# Patient Record
Sex: Female | Born: 1950 | Race: White | Hispanic: No | Marital: Married | Smoking: Never smoker
Health system: Southern US, Community
[De-identification: ages and names within clinical notes are randomized; demographics above are authoritative.]

## PROBLEM LIST (undated history)

## (undated) DIAGNOSIS — M26609 Unspecified temporomandibular joint disorder, unspecified side: Secondary | ICD-10-CM

## (undated) DIAGNOSIS — Z87442 Personal history of urinary calculi: Secondary | ICD-10-CM

## (undated) DIAGNOSIS — Z8489 Family history of other specified conditions: Secondary | ICD-10-CM

## (undated) DIAGNOSIS — M1990 Unspecified osteoarthritis, unspecified site: Secondary | ICD-10-CM

## (undated) DIAGNOSIS — K219 Gastro-esophageal reflux disease without esophagitis: Secondary | ICD-10-CM

## (undated) HISTORY — DX: Unspecified osteoarthritis, unspecified site: M19.90

## (undated) HISTORY — DX: Personal history of urinary calculi: Z87.442

## (undated) HISTORY — DX: Unspecified temporomandibular joint disorder, unspecified side: M26.609

## (undated) HISTORY — PX: TUBAL LIGATION: SHX77

---

## 1999-12-22 ENCOUNTER — Other Ambulatory Visit: Admission: RE | Admit: 1999-12-22 | Discharge: 1999-12-22 | Payer: Self-pay | Admitting: Internal Medicine

## 2001-03-06 ENCOUNTER — Other Ambulatory Visit: Admission: RE | Admit: 2001-03-06 | Discharge: 2001-03-06 | Payer: Self-pay | Admitting: Internal Medicine

## 2003-03-05 ENCOUNTER — Other Ambulatory Visit: Admission: RE | Admit: 2003-03-05 | Discharge: 2003-03-05 | Payer: Self-pay | Admitting: Internal Medicine

## 2004-10-18 ENCOUNTER — Ambulatory Visit: Payer: Self-pay | Admitting: Internal Medicine

## 2004-10-25 ENCOUNTER — Ambulatory Visit: Payer: Self-pay | Admitting: Internal Medicine

## 2004-10-25 ENCOUNTER — Encounter: Payer: Self-pay | Admitting: Internal Medicine

## 2004-10-25 ENCOUNTER — Other Ambulatory Visit: Admission: RE | Admit: 2004-10-25 | Discharge: 2004-10-25 | Payer: Self-pay | Admitting: Internal Medicine

## 2006-04-13 ENCOUNTER — Ambulatory Visit: Payer: Self-pay | Admitting: Internal Medicine

## 2007-01-04 HISTORY — PX: COLONOSCOPY: SHX174

## 2007-02-06 ENCOUNTER — Ambulatory Visit: Payer: Self-pay | Admitting: Internal Medicine

## 2007-02-06 DIAGNOSIS — J069 Acute upper respiratory infection, unspecified: Secondary | ICD-10-CM | POA: Insufficient documentation

## 2007-02-06 DIAGNOSIS — M1990 Unspecified osteoarthritis, unspecified site: Secondary | ICD-10-CM

## 2007-02-06 DIAGNOSIS — Z87442 Personal history of urinary calculi: Secondary | ICD-10-CM

## 2007-02-06 HISTORY — DX: Personal history of urinary calculi: Z87.442

## 2007-02-06 HISTORY — DX: Unspecified osteoarthritis, unspecified site: M19.90

## 2007-02-20 ENCOUNTER — Encounter: Payer: Self-pay | Admitting: Internal Medicine

## 2007-03-07 ENCOUNTER — Ambulatory Visit: Payer: Self-pay | Admitting: Internal Medicine

## 2007-03-07 LAB — CONVERTED CEMR LAB
Ketones, urine, test strip: NEGATIVE
Nitrite: NEGATIVE
Urobilinogen, UA: NEGATIVE
pH: >=9

## 2007-03-08 LAB — CONVERTED CEMR LAB
ALT: 17 units/L (ref 0–35)
AST: 23 units/L (ref 0–37)
Alkaline Phosphatase: 60 units/L (ref 39–117)
BUN: 9 mg/dL (ref 6–23)
Basophils Relative: 0.7 % (ref 0.0–1.0)
Bilirubin, Direct: 0.1 mg/dL (ref 0.0–0.3)
CO2: 29 meq/L (ref 19–32)
Calcium: 9.8 mg/dL (ref 8.4–10.5)
Chloride: 102 meq/L (ref 96–112)
Cholesterol: 248 mg/dL (ref 0–200)
Eosinophils Relative: 4.1 % (ref 0.0–5.0)
GFR calc Af Amer: 83 mL/min
Glucose, Bld: 95 mg/dL (ref 70–99)
HDL: 61.2 mg/dL (ref >39.0)
Monocytes Relative: 10.4 % (ref 3.0–11.0)
Platelets: 239 10*3/uL (ref 150–400)
RBC: 4.47 M/uL (ref 3.87–5.11)
RDW: 12.3 % (ref 11.5–14.6)
Total CHOL/HDL Ratio: 4.1
Total Protein: 6.5 g/dL (ref 6.0–8.3)
Triglycerides: 69 mg/dL (ref 0–149)
VLDL: 14 mg/dL (ref 0–40)
WBC: 3.9 10*3/uL — ABNORMAL LOW (ref 4.5–10.5)

## 2007-03-13 ENCOUNTER — Ambulatory Visit: Payer: Self-pay | Admitting: Internal Medicine

## 2008-02-21 ENCOUNTER — Ambulatory Visit: Payer: Self-pay | Admitting: Internal Medicine

## 2008-02-21 DIAGNOSIS — M779 Enthesopathy, unspecified: Secondary | ICD-10-CM | POA: Insufficient documentation

## 2008-03-19 ENCOUNTER — Encounter: Payer: Self-pay | Admitting: Internal Medicine

## 2008-04-28 ENCOUNTER — Ambulatory Visit: Payer: Self-pay | Admitting: Internal Medicine

## 2008-04-28 LAB — CONVERTED CEMR LAB
ALT: 18 units/L (ref 0–35)
AST: 19 units/L (ref 0–37)
Albumin: 3.9 g/dL (ref 3.5–5.2)
Alkaline Phosphatase: 70 units/L (ref 39–117)
BUN: 9 mg/dL (ref 6–23)
Basophils Relative: 0.8 % (ref 0.0–3.0)
Bilirubin Urine: NEGATIVE
Bilirubin, Direct: 0.1 mg/dL (ref 0.0–0.3)
CO2: 32 meq/L (ref 19–32)
Direct LDL: 160.6 mg/dL
Eosinophils Relative: 4.6 % (ref 0.0–5.0)
GFR calc non Af Amer: 90.73 mL/min (ref >60)
Glucose, Bld: 90 mg/dL (ref 70–99)
HDL: 65.5 mg/dL (ref >39.00)
Hemoglobin, Urine: NEGATIVE
Hemoglobin: 13.3 g/dL (ref 12.0–15.0)
Ketones, ur: NEGATIVE mg/dL
Lymphocytes Relative: 44 % (ref 12.0–46.0)
Monocytes Relative: 9.2 % (ref 3.0–12.0)
Neutro Abs: 1.6 10*3/uL (ref 1.4–7.7)
Neutrophils Relative %: 41.4 % — ABNORMAL LOW (ref 43.0–77.0)
Nitrite: NEGATIVE
Potassium: 3.8 meq/L (ref 3.5–5.1)
RBC: 4.28 M/uL (ref 3.87–5.11)
Sodium: 143 meq/L (ref 135–145)
Total Protein, Urine: NEGATIVE mg/dL
Total Protein: 6.6 g/dL (ref 6.0–8.3)
Urine Glucose: NEGATIVE mg/dL
VLDL: 8.6 mg/dL (ref 0.0–40.0)
WBC: 3.8 10*3/uL — ABNORMAL LOW (ref 4.5–10.5)
pH: 7.5 (ref 5.0–8.0)

## 2008-05-05 ENCOUNTER — Ambulatory Visit: Payer: Self-pay | Admitting: Internal Medicine

## 2008-06-03 ENCOUNTER — Ambulatory Visit: Payer: Self-pay | Admitting: Gastroenterology

## 2008-07-14 ENCOUNTER — Ambulatory Visit: Payer: Self-pay | Admitting: Gastroenterology

## 2009-02-27 ENCOUNTER — Ambulatory Visit: Payer: Self-pay | Admitting: Family Medicine

## 2009-02-27 DIAGNOSIS — M26609 Unspecified temporomandibular joint disorder, unspecified side: Secondary | ICD-10-CM | POA: Insufficient documentation

## 2009-02-27 HISTORY — DX: Unspecified temporomandibular joint disorder, unspecified side: M26.609

## 2010-02-04 NOTE — Assessment & Plan Note (Signed)
Summary: Pain ear and jaw/cjr   Vital Signs:  Patient profile:   60 year old female Temp:     99.1 degrees F oral BP sitting:   140 / 70  (left arm) Cuff size:   regular  Vitals Entered By: Sid Falcon LPN (February 27, 2009 2:28 PM) CC: ear, jaw, headache pain   History of Present Illness: Several month history of progressive left jaw pain. No chest pain. Pain worse with eating. This started around last November. Symptoms have been somewhat progressive. Pain mostly around the TMJ joint region with radiation toward that ear. Possible bruxism. Patient saw her dentist and has been using Advil and warm compresses along with a mouthguard without much improvement. She has noticed a clicking sensation when she opens and closes her jaw.  Allergies (verified): No Known Drug Allergies  Past History:  Past Medical History: Last updated: 05/05/2008 Osteoarthritis Nephrolithiasis, hx of mild dyslipidemia with a high HDL  Past Surgical History: Last updated: 05/05/2008 Tubal ligation colonoscopy-none  Family History: Last updated: 03/13/2007 father history gout, renal stones, hypertension, mild cognitive impairment mother- history of DJD, status post left total knee replacement, history of hypertension; history of coronary artery disease two brothers in good health present history lymphoma uncle history pancreatic cancer  Social History: Last updated: 05/05/2008 no children Retired  from [employer] Married Never Smoked PMH reviewed for relevance, PSH reviewed for relevance  Review of Systems  The patient denies anorexia, fever, weight loss, chest pain, and headaches.    Physical Exam  General:  Well-developed,well-nourished,in no acute distress; alert,appropriate and cooperative throughout examination Head:  Normocephalic and atraumatic without obvious abnormalities. No apparent alopecia or balding. Ears:  External ear exam shows no significant lesions or deformities.   Otoscopic examination reveals clear canals, tympanic membranes are intact bilaterally without bulging, retraction, inflammation or discharge. Hearing is grossly normal bilaterally. Mouth:  patient has tenderness left TMJ joint and pain with opening and closing jaw. Oropharynx clear Neck:  No deformities, masses, or tenderness noted. Lungs:  Normal respiratory effort, chest expands symmetrically. Lungs are clear to auscultation, no crackles or wheezes. Heart:  normal rate and regular rhythm.   Skin:  no rashes Cervical Nodes:  No lymphadenopathy noted   Impression & Recommendations:  Problem # 1:  TMJ SYNDROME (ICD-524.60)  given failure of conservative treatment and several months of symptoms recommend referral to oral maxillofacial surgeon  Orders: Dental Referral (Dentist)  Patient Instructions: 1)  Avoid eating hard to chew foods. 2)  Continue Advil as needed.

## 2010-02-17 ENCOUNTER — Encounter (INDEPENDENT_AMBULATORY_CARE_PROVIDER_SITE_OTHER): Payer: Self-pay | Admitting: *Deleted

## 2010-02-17 ENCOUNTER — Other Ambulatory Visit: Payer: Self-pay | Admitting: Internal Medicine

## 2010-02-17 ENCOUNTER — Other Ambulatory Visit: Payer: 59

## 2010-02-17 DIAGNOSIS — E785 Hyperlipidemia, unspecified: Secondary | ICD-10-CM

## 2010-02-17 DIAGNOSIS — Z0000 Encounter for general adult medical examination without abnormal findings: Secondary | ICD-10-CM

## 2010-02-17 LAB — CBC WITH DIFFERENTIAL/PLATELET
Basophils Absolute: 0 10*3/uL (ref 0.0–0.1)
HCT: 39.2 % (ref 36.0–46.0)
Lymphs Abs: 1.6 10*3/uL (ref 0.7–4.0)
MCV: 93.5 fl (ref 78.0–100.0)
Monocytes Absolute: 0.4 10*3/uL (ref 0.1–1.0)
Monocytes Relative: 9.9 % (ref 3.0–12.0)
Neutrophils Relative %: 46.5 % (ref 43.0–77.0)
Platelets: 214 10*3/uL (ref 150.0–400.0)
RDW: 13.5 % (ref 11.5–14.6)

## 2010-02-17 LAB — BASIC METABOLIC PANEL
BUN: 15 mg/dL (ref 6–23)
Creatinine, Ser: 0.7 mg/dL (ref 0.4–1.2)
GFR: 90.18 mL/min (ref >60.00)
Glucose, Bld: 86 mg/dL (ref 70–99)
Potassium: 4.4 mEq/L (ref 3.5–5.1)

## 2010-02-17 LAB — LIPID PANEL
Cholesterol: 240 mg/dL — ABNORMAL HIGH (ref 0–200)
HDL: 65.7 mg/dL (ref >39.00)

## 2010-02-17 LAB — URINALYSIS
Bilirubin Urine: NEGATIVE
Hgb urine dipstick: NEGATIVE
Ketones, ur: NEGATIVE
Total Protein, Urine: NEGATIVE
pH: 5.5 (ref 5.0–8.0)

## 2010-02-17 LAB — HEPATIC FUNCTION PANEL: Total Bilirubin: 0.7 mg/dL (ref 0.3–1.2)

## 2010-02-19 ENCOUNTER — Encounter: Payer: Self-pay | Admitting: Internal Medicine

## 2010-02-22 ENCOUNTER — Encounter: Payer: Self-pay | Admitting: Internal Medicine

## 2010-02-22 ENCOUNTER — Other Ambulatory Visit (HOSPITAL_COMMUNITY)
Admission: RE | Admit: 2010-02-22 | Discharge: 2010-02-22 | Disposition: A | Discharge status: Home / Self Care | Payer: 59 | Source: Ambulatory Visit | Attending: Internal Medicine | Admitting: Internal Medicine

## 2010-02-22 ENCOUNTER — Ambulatory Visit (INDEPENDENT_AMBULATORY_CARE_PROVIDER_SITE_OTHER): Payer: 59 | Admitting: Internal Medicine

## 2010-02-22 VITALS — BP 110/70 | HR 77 | Temp 98.2°F | Resp 14 | Ht 69.0 in | Wt 166.0 lb

## 2010-02-22 DIAGNOSIS — M1990 Unspecified osteoarthritis, unspecified site: Secondary | ICD-10-CM

## 2010-02-22 DIAGNOSIS — M26609 Unspecified temporomandibular joint disorder, unspecified side: Secondary | ICD-10-CM

## 2010-02-22 DIAGNOSIS — Z01419 Encounter for gynecological examination (general) (routine) without abnormal findings: Secondary | ICD-10-CM | POA: Insufficient documentation

## 2010-02-22 DIAGNOSIS — Z87442 Personal history of urinary calculi: Secondary | ICD-10-CM

## 2010-02-22 DIAGNOSIS — Z0000 Encounter for general adult medical examination without abnormal findings: Secondary | ICD-10-CM

## 2010-02-22 NOTE — Progress Notes (Signed)
  Subjective:    Patient ID: Tara Acosta, female    DOB: [birth year], 60 y.o.   MRN: [id number]  HPI  A 60 year old patient who is seen today for a health maintenance examination.  She does remarkably well.  This past fall.  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  She has a history of nephrolithiasis, which has been asymptomatic.  No concerns or complaints.  She did have a mammogram performed last month.  A colonoscopy done in 2009    Review of Systems  Constitutional: Negative for fever, appetite change, fatigue and unexpected weight change.  HENT: Negative for hearing loss, ear pain, nosebleeds, congestion, sore throat, mouth sores, trouble swallowing, neck stiffness, dental problem, voice change, sinus pressure and tinnitus.   Eyes: Negative for photophobia, pain, redness and visual disturbance.  Respiratory: Negative for cough, chest tightness and shortness of breath.   Cardiovascular: Negative for chest pain, palpitations and leg swelling.  Gastrointestinal: Negative for nausea, vomiting, abdominal pain, diarrhea, constipation, blood in stool, abdominal distention and rectal pain.  Genitourinary: Negative for dysuria, urgency, frequency, hematuria, flank pain, vaginal bleeding, vaginal discharge, difficulty urinating, genital sores, vaginal pain, menstrual problem and pelvic pain.  Musculoskeletal: Negative for back pain and arthralgias.  Skin: Negative for rash.  Neurological: Negative for dizziness, syncope, speech difficulty, weakness, light-headedness, numbness and headaches.  Hematological: Negative for adenopathy. Does not bruise/bleed easily.  Psychiatric/Behavioral: Negative for suicidal ideas, behavioral problems, self-injury, dysphoric mood and agitation. The patient is not nervous/anxious.        Objective:   Physical Exam  Constitutional: She is oriented to person, place, and time. She appears well-developed and well-nourished.  HENT:  Head:  Normocephalic and atraumatic.  Right Ear: External ear normal.  Left Ear: External ear normal.  Mouth/Throat: Oropharynx is clear and moist.  Eyes: Conjunctivae and EOM are normal.  Neck: Normal range of motion. Neck supple. No JVD present. No thyromegaly present.  Cardiovascular: Normal rate, regular rhythm, normal heart sounds and intact distal pulses.   No murmur heard. Pulmonary/Chest: Effort normal and breath sounds normal. She has no wheezes. She has no rales.  Abdominal: Soft. Bowel sounds are normal. She exhibits no distension and no mass. There is no tenderness. There is no rebound and no guarding.  Genitourinary: Vagina normal.  Musculoskeletal: Normal range of motion. She exhibits no edema and no tenderness.  Neurological: She is alert and oriented to person, place, and time. She has normal reflexes. No cranial nerve deficit. She exhibits normal muscle tone. Coordination normal.  Skin: Skin is warm and dry. No rash noted.  Psychiatric: She has a normal mood and affect. Her behavior is normal.          Assessment & Plan:  Unremarkable preventive examination

## 2010-02-22 NOTE — Patient Instructions (Signed)
Take a calcium supplement, plus [phone] units of vitamin D    It is important that you exercise regularly, at least 20 minutes 3 to 4 times per week.  If you develop chest pain or shortness of breath seek  medical attention.  Call or return to clinic prn if these symptoms worsen or fail to improve as anticipated.

## 2010-02-22 NOTE — Progress Notes (Signed)
Addended by: Duard Brady on: 02/22/2010 02:46 PM   Modules accepted: Orders

## 2010-05-04 ENCOUNTER — Encounter: Payer: Self-pay | Admitting: Internal Medicine

## 2010-09-27 ENCOUNTER — Emergency Department (HOSPITAL_COMMUNITY): Payer: 59

## 2010-09-27 ENCOUNTER — Observation Stay (HOSPITAL_COMMUNITY)
Admission: EM | Admit: 2010-09-27 | Discharge: 2010-09-28 | Disposition: A | Discharge status: Home / Self Care | Payer: 59 | Source: Ambulatory Visit | Attending: Emergency Medicine | Admitting: Emergency Medicine

## 2010-09-27 DIAGNOSIS — R079 Chest pain, unspecified: Principal | ICD-10-CM | POA: Insufficient documentation

## 2010-09-27 LAB — DIFFERENTIAL
Basophils Absolute: 0 10*3/uL (ref 0.0–0.1)
Basophils Relative: 0 % (ref 0–1)
Eosinophils Absolute: 0.1 10*3/uL (ref 0.0–0.7)
Monocytes Absolute: 0.6 10*3/uL (ref 0.1–1.0)
Neutro Abs: 2.6 10*3/uL (ref 1.7–7.7)
Neutrophils Relative %: 50 % (ref 43–77)

## 2010-09-27 LAB — COMPREHENSIVE METABOLIC PANEL
ALT: 15 U/L (ref 0–35)
AST: 18 U/L (ref 0–37)
Albumin: 4.3 g/dL (ref 3.5–5.2)
Alkaline Phosphatase: 65 U/L (ref 39–117)
Glucose, Bld: 98 mg/dL (ref 70–99)
Potassium: 3.7 mEq/L (ref 3.5–5.1)
Sodium: 142 mEq/L (ref 135–145)
Total Protein: 7 g/dL (ref 6.0–8.3)

## 2010-09-27 LAB — CBC
Hemoglobin: 12.9 g/dL (ref 12.0–15.0)
MCHC: 33.9 g/dL (ref 30.0–36.0)
Platelets: 205 10*3/uL (ref 150–400)
RDW: 12.6 % (ref 11.5–15.5)

## 2010-09-27 LAB — CK TOTAL AND CKMB (NOT AT ARMC): Relative Index: INVALID (ref 0.0–2.5)

## 2010-09-27 LAB — TROPONIN I: Troponin I: <0.3 ng/mL (ref <0.30)

## 2010-09-28 DIAGNOSIS — R072 Precordial pain: Secondary | ICD-10-CM

## 2010-09-28 LAB — POCT I-STAT TROPONIN I
Troponin i, poc: 0 ng/mL (ref 0.00–0.08)
Troponin i, poc: 0 ng/mL (ref 0.00–0.08)

## 2010-09-29 ENCOUNTER — Ambulatory Visit (INDEPENDENT_AMBULATORY_CARE_PROVIDER_SITE_OTHER): Payer: 59 | Admitting: Internal Medicine

## 2010-09-29 ENCOUNTER — Encounter: Payer: Self-pay | Admitting: Internal Medicine

## 2010-09-29 VITALS — BP 128/80 | Temp 98.4°F | Wt 158.0 lb

## 2010-09-29 DIAGNOSIS — Z23 Encounter for immunization: Secondary | ICD-10-CM

## 2010-09-29 DIAGNOSIS — Z0000 Encounter for general adult medical examination without abnormal findings: Secondary | ICD-10-CM

## 2010-09-29 DIAGNOSIS — K219 Gastro-esophageal reflux disease without esophagitis: Secondary | ICD-10-CM | POA: Insufficient documentation

## 2010-09-29 NOTE — Patient Instructions (Signed)
Avoids foods high in acid such as tomatoes citrus juices, and spicy foods.  Avoid eating within two hours of lying down or before exercising.  Do not overheat.  Try smaller more frequent meals.  If symptoms persist, elevate the head of her bed 12 inches while sleeping.  Acid Reflux (GERD) Acid reflux is also called gastroesophageal reflux disease (GERD). Your stomach makes acid to help digest food. Acid reflux happens when acid from your stomach goes into the tube between your mouth and stomach (esophagus). Your stomach is protected from the acid, but this tube is not. When acid gets into the tube, it may cause a burning feeling in the chest (heartburn). Besides heartburn, other health problems can happen if the acid keeps going into the tube. Some causes of acid reflux include:  Being overweight.   Smoking.   Drinking alcohol.   Eating large meals.   Eating meals and then going to bed right away.   Eating certain foods.   Increased stomach acid production.  HOME CARE  Take all medicine as told by your doctor.   You may need to:   Lose weight.   Avoid alcohol.   Quit smoking.   Do not eat big meals. It is better to eat smaller meals throughout the day.   Do not eat a meal and then nap or go to bed.   Sleep with your head higher than your stomach.   Avoid foods that bother you.   You may need more tests, or you may need to see a special doctor.  GET HELP RIGHT AWAY IF:  You have chest pain that is different than before.   You have pain that goes to your arms, jaw, or between your shoulder blades.   You throw up (vomit) blood, dark brown liquid, or your throw up looks like coffee grounds.   You have trouble swallowing.   You have trouble breathing or cannot stop coughing.   You feel dizzy or pass out.   Your skin is cool, wet, and pale.   Your medicine is not helping.  MAKE SURE YOU:    Understand these instructions.   Will watch your condition.   Will get  help right away if you are not doing well or get worse.  Document Released: 06/08/2007 Document Re-Released: 03/16/2009 [facility] Patient Information 2011 [place], [place].

## 2010-09-29 NOTE — Progress Notes (Signed)
  Subjective:    Patient ID: Tara Acosta, female    DOB: 09-19-1950, 60 y.o.   MRN: [id number]  HPI  60 year old patient who has a long history of gastroesophageal reflux disease. In the past this has been triggered by late evening of large meals. 2 days ago she was seen in the ED after prolong the burning chest pain associated with belching ED evaluation was negative and included a stress echocardiogram. She presently is on Protonix and doing better. Antireflux regimen discussed at length    Review of Systems  Cardiovascular: Positive for chest pain.       Objective:   Physical Exam  Constitutional: She is oriented to person, place, and time. She appears well-developed and well-nourished.  HENT:  Head: Normocephalic.  Right Ear: External ear normal.  Left Ear: External ear normal.  Mouth/Throat: Oropharynx is clear and moist.  Eyes: Conjunctivae and EOM are normal. Pupils are equal, round, and reactive to light.  Neck: Normal range of motion. Neck supple. No thyromegaly present.  Cardiovascular: Normal rate, regular rhythm, normal heart sounds and intact distal pulses.   Pulmonary/Chest: Effort normal and breath sounds normal.  Abdominal: Soft. Bowel sounds are normal. She exhibits no mass. There is no tenderness.  Musculoskeletal: Normal range of motion.  Lymphadenopathy:    She has no cervical adenopathy.  Neurological: She is alert and oriented to person, place, and time.  Skin: Skin is warm and dry. No rash noted.  Psychiatric: She has a normal mood and affect. Her behavior is normal.          Assessment & Plan:   Gastroesophageal reflux disease. Hospital records reviewed. She'll be maintained on a aggressive antireflux regimen and short-term PPI use Detailed instructions dispensed

## 2011-06-08 ENCOUNTER — Encounter: Payer: Self-pay | Admitting: Internal Medicine

## 2011-09-26 ENCOUNTER — Other Ambulatory Visit (INDEPENDENT_AMBULATORY_CARE_PROVIDER_SITE_OTHER): Payer: 59

## 2011-09-26 DIAGNOSIS — Z13220 Encounter for screening for lipoid disorders: Secondary | ICD-10-CM

## 2011-09-26 DIAGNOSIS — Z0000 Encounter for general adult medical examination without abnormal findings: Secondary | ICD-10-CM

## 2011-09-26 LAB — POCT URINALYSIS DIPSTICK
Bilirubin, UA: NEGATIVE
Blood, UA: NEGATIVE
Glucose, UA: NEGATIVE
Ketones, UA: NEGATIVE
Leukocytes, UA: NEGATIVE
Nitrite, UA: NEGATIVE
Protein, UA: NEGATIVE
Spec Grav, UA: 1.015
Urobilinogen, UA: 0.2
pH, UA: 7.5

## 2011-09-26 LAB — CBC WITH DIFFERENTIAL/PLATELET
Basophils Absolute: 0 10*3/uL (ref 0.0–0.1)
Basophils Relative: 0.7 % (ref 0.0–3.0)
Eosinophils Absolute: 0.1 10*3/uL (ref 0.0–0.7)
Eosinophils Relative: 4 % (ref 0.0–5.0)
HCT: 39.4 % (ref 36.0–46.0)
Hemoglobin: 13 g/dL (ref 12.0–15.0)
Lymphocytes Relative: 39.6 % (ref 12.0–46.0)
Lymphs Abs: 1.3 10*3/uL (ref 0.7–4.0)
MCHC: 32.9 g/dL (ref 30.0–36.0)
MCV: 94.8 fl (ref 78.0–100.0)
Monocytes Absolute: 0.3 10*3/uL (ref 0.1–1.0)
Monocytes Relative: 9.3 % (ref 3.0–12.0)
Neutro Abs: 1.6 10*3/uL (ref 1.4–7.7)
Neutrophils Relative %: 46.4 % (ref 43.0–77.0)
Platelets: 208 10*3/uL (ref 150.0–400.0)
RBC: 4.15 Mil/uL (ref 3.87–5.11)
RDW: 13.4 % (ref 11.5–14.6)
WBC: 3.4 10*3/uL — ABNORMAL LOW (ref 4.5–10.5)

## 2011-09-27 LAB — HEPATIC FUNCTION PANEL
ALT: 15 U/L (ref 0–35)
AST: 17 U/L (ref 0–37)
Bilirubin, Direct: 0.1 mg/dL (ref 0.0–0.3)
Total Bilirubin: 0.6 mg/dL (ref 0.3–1.2)

## 2011-09-27 LAB — LIPID PANEL
HDL: 65.3 mg/dL (ref >39.00)
Triglycerides: 62 mg/dL (ref 0.0–149.0)
VLDL: 12.4 mg/dL (ref 0.0–40.0)

## 2011-09-27 LAB — BASIC METABOLIC PANEL
BUN: 11 mg/dL (ref 6–23)
Creatinine, Ser: 0.7 mg/dL (ref 0.4–1.2)
GFR: 85.47 mL/min (ref >60.00)
Glucose, Bld: 87 mg/dL (ref 70–99)

## 2011-09-27 LAB — TSH: TSH: 1.93 u[IU]/mL (ref 0.35–5.50)

## 2011-10-03 ENCOUNTER — Encounter: Payer: Self-pay | Admitting: Internal Medicine

## 2011-10-03 ENCOUNTER — Ambulatory Visit (INDEPENDENT_AMBULATORY_CARE_PROVIDER_SITE_OTHER): Payer: 59 | Admitting: Internal Medicine

## 2011-10-03 VITALS — BP 120/80 | HR 74 | Temp 98.1°F | Resp 18 | Ht 68.75 in | Wt 159.0 lb

## 2011-10-03 DIAGNOSIS — Z0000 Encounter for general adult medical examination without abnormal findings: Secondary | ICD-10-CM

## 2011-10-03 DIAGNOSIS — K219 Gastro-esophageal reflux disease without esophagitis: Secondary | ICD-10-CM

## 2011-10-03 DIAGNOSIS — Z85820 Personal history of malignant melanoma of skin: Secondary | ICD-10-CM | POA: Insufficient documentation

## 2011-10-03 DIAGNOSIS — Z23 Encounter for immunization: Secondary | ICD-10-CM

## 2011-10-03 NOTE — Patient Instructions (Addendum)
It is important that you exercise regularly, at least 20 minutes 3 to 4 times per week.  If you develop chest pain or shortness of breath seek  medical attention.  Take a calcium supplement, plus [phone] units of vitamin D  Avoids foods high in acid such as tomatoes citrus juices, and spicy foods.  Avoid eating within two hours of lying down or before exercising.  Do not overheat.  Try smaller more frequent meals.  If symptoms persist, elevate the head of her bed 12 inches while sleeping.

## 2011-10-03 NOTE — Progress Notes (Signed)
Subjective:    Patient ID: Tara Acosta, female    DOB: Jan 31, 1950, 61 y.o.   MRN: [id number]  HPI  61 year old patient who is seen today for a wellness exam. She enjoys excellent health has had some GERD symptoms which grossly are controlled with diet and when necessary OTC H2 blockers. Only complaint is some mild right groin discomfort it is aggravated by prolonged sitting but is improved with standing and stretching. She does have a history of nephrolithiasis. She presently takes no chronic medications. Past medical history is unremarkable except for a resection for a melanoma involving the right lower arm She's had a tubal ligation in the past otherwise no surgeries no pregnancies she's had one hospitalization that she recalls her symptomatic renal colic Family history father died age 72 history of hypertension and gout Mother age 63 he has osteoarthritis and hypertension 2 brothers one died of a gunshot wound  Past Medical History  Diagnosis Date  . NEPHROLITHIASIS, HX OF 02/06/2007  . OSTEOARTHRITIS 02/06/2007  . TMJ SYNDROME 02/27/2009    History   Social History  . Marital Status: Married    Spouse Name: N/A    Number of Children: N/A  . Years of Education: N/A   Occupational History  . retired [employer]   Social History Main Topics  . Smoking status: Never Smoker   . Smokeless tobacco: Never Used  . Alcohol Use: No  . Drug Use: No  . Sexually Active: Not on file   Other Topics Concern  . Not on file   Social History Narrative   MarriedNo children    Past Surgical History  Procedure Date  . Tubal ligation   . Colonoscopy 2009    Family History  Problem Relation Age of Onset  . Gout Father   . Hypertension Father   . Dementia Father   . Hypertension Mother   . Coronary artery disease Mother   . Pancreatic cancer      Uncle    No Known Allergies  Current Outpatient Prescriptions on File Prior to Visit  Medication Sig Dispense Refill  .  pantoprazole (PROTONIX) 40 MG tablet Take 40 mg by mouth daily.         BP 120/80  Pulse 74  Temp 98.1 F (36.7 C) (Oral)  Resp 18  Ht 5' 8.75" (1.746 m)  Wt 159 lb (72.122 kg)  BMI 23.65 kg/m2  SpO2 97%       Review of Systems  Constitutional: Negative for fever, appetite change, fatigue and unexpected weight change.  HENT: Negative for hearing loss, ear pain, nosebleeds, congestion, sore throat, mouth sores, trouble swallowing, neck stiffness, dental problem, voice change, sinus pressure and tinnitus.   Eyes: Negative for photophobia, pain, redness and visual disturbance.  Respiratory: Negative for cough, chest tightness and shortness of breath.   Cardiovascular: Negative for chest pain, palpitations and leg swelling.  Gastrointestinal: Negative for nausea, vomiting, abdominal pain, diarrhea, constipation, blood in stool, abdominal distention and rectal pain.  Genitourinary: Negative for dysuria, urgency, frequency, hematuria, flank pain, vaginal bleeding, vaginal discharge, difficulty urinating, genital sores, vaginal pain, menstrual problem and pelvic pain.  Musculoskeletal: Negative for back pain and arthralgias.       Pain in right groin region  Skin: Negative for rash.  Neurological: Negative for dizziness, syncope, speech difficulty, weakness, light-headedness, numbness and headaches.  Hematological: Negative for adenopathy. Does not bruise/bleed easily.  Psychiatric/Behavioral: Negative for suicidal ideas, behavioral problems, self-injury, dysphoric mood and agitation. The  patient is not nervous/anxious.        Objective:   Physical Exam  Constitutional: She is oriented to person, place, and time. She appears well-developed and well-nourished.  HENT:  Head: Normocephalic and atraumatic.  Right Ear: External ear normal.  Left Ear: External ear normal.  Mouth/Throat: Oropharynx is clear and moist.  Eyes: Conjunctivae normal and EOM are normal.  Neck: Normal range of  motion. Neck supple. No JVD present. No thyromegaly present.  Cardiovascular: Normal rate, regular rhythm, normal heart sounds and intact distal pulses.   No murmur heard. Pulmonary/Chest: Effort normal and breath sounds normal. She has no wheezes. She has no rales.  Abdominal: Soft. Bowel sounds are normal. She exhibits no distension and no mass. There is no tenderness. There is no rebound and no guarding.  Musculoskeletal: Normal range of motion. She exhibits no edema and no tenderness.  Neurological: She is alert and oriented to person, place, and time. She has normal reflexes. No cranial nerve deficit. She exhibits normal muscle tone. Coordination normal.  Skin: Skin is warm and dry. No rash noted.  Psychiatric: She has a normal mood and affect. Her behavior is normal.          Assessment & Plan:    preventive health exam Gastroesophageal reflux disease. Antireflux regimen discussed Right groin pain. Sounds musculoligamentous. Gentle stretching and range of motion exercises discussed Mild dyslipidemia. Improved  Vitamin D exercise and calcium supplementation all encouraged Annual dermatologic evaluation Recheck here one year or when necessary

## 2012-05-03 ENCOUNTER — Ambulatory Visit (INDEPENDENT_AMBULATORY_CARE_PROVIDER_SITE_OTHER): Payer: 59 | Admitting: Internal Medicine

## 2012-05-03 ENCOUNTER — Encounter: Payer: Self-pay | Admitting: Internal Medicine

## 2012-05-03 VITALS — BP 120/80 | HR 88 | Temp 97.5°F | Resp 18 | Wt 161.0 lb

## 2012-05-03 DIAGNOSIS — M1990 Unspecified osteoarthritis, unspecified site: Secondary | ICD-10-CM

## 2012-05-03 NOTE — Progress Notes (Signed)
  Subjective:    Patient ID: Tara Acosta, female    DOB: 05-06-1950, 62 y.o.   MRN: [id number]  HPI   62 year old patient has a history of osteoarthritis with symptomatic right hip pain. She has been evaluated by orthopedics and is considering the surgery in the near future.  She did receive a hip injection in January with 6 weeks of benefit. She is considering another local injection this month. She does have some primary caregiver responsibilities caring for a 64 mother.  She also has advanced radiographic changes involving the left hip area  Past Medical History  Diagnosis Date  . NEPHROLITHIASIS, HX OF 02/06/2007  . OSTEOARTHRITIS 02/06/2007  . TMJ SYNDROME 02/27/2009    History   Social History  . Marital Status: Married    Spouse Name: N/A    Number of Children: N/A  . Years of Education: N/A   Occupational History  . retired [employer]   Social History Main Topics  . Smoking status: Never Smoker   . Smokeless tobacco: Never Used  . Alcohol Use: No  . Drug Use: No  . Sexually Active: Not on file   Other Topics Concern  . Not on file   Social History Narrative   Married   No children    Past Surgical History  Procedure Laterality Date  . Tubal ligation    . Colonoscopy  2009    Family History  Problem Relation Age of Onset  . Gout Father   . Hypertension Father   . Dementia Father   . Hypertension Mother   . Coronary artery disease Mother   . Pancreatic cancer      Uncle    No Known Allergies  No current outpatient prescriptions on file prior to visit.   No current facility-administered medications on file prior to visit.    BP 120/80  Pulse 88  Temp(Src) 97.5 F (36.4 C) (Oral)  Resp 18  Wt 161 lb (73.029 kg)  BMI 23.96 kg/m2  SpO2 98%       Review of Systems  Musculoskeletal: Positive for arthralgias and gait problem.       Objective:   Physical Exam  Constitutional: She is oriented to person, place, and time. She  appears well-developed and well-nourished.  HENT:  Head: Normocephalic.  Right Ear: External ear normal.  Left Ear: External ear normal.  Mouth/Throat: Oropharynx is clear and moist.  Eyes: Conjunctivae and EOM are normal. Pupils are equal, round, and reactive to light.  Neck: Normal range of motion. Neck supple. No thyromegaly present.  Cardiovascular: Normal rate, regular rhythm, normal heart sounds and intact distal pulses.   Pulmonary/Chest: Effort normal and breath sounds normal.  Abdominal: Soft. Bowel sounds are normal. She exhibits no mass. There is no tenderness.  Lymphadenopathy:    She has no cervical adenopathy.  Neurological: She is alert and oriented to person, place, and time.  Skin: Skin is warm and dry. No rash noted.  Psychiatric: She has a normal mood and affect. Her behavior is normal.          Assessment & Plan:   Advanced osteoarthritis right hip. Patient will consider surgery in the near future Gastroesophageal reflux disease. Continue Zantac when necessary as well as an anti-reflex regimen  Followup orthopedics. Patient to consider another local injection of Depo-Medrol

## 2012-05-03 NOTE — Patient Instructions (Signed)
Orthopedic followup Return here as necessary

## 2012-05-31 ENCOUNTER — Other Ambulatory Visit (HOSPITAL_COMMUNITY): Payer: Self-pay | Admitting: Orthopaedic Surgery

## 2012-06-13 ENCOUNTER — Encounter (HOSPITAL_COMMUNITY): Payer: Self-pay | Admitting: Pharmacy Technician

## 2012-06-18 ENCOUNTER — Encounter (HOSPITAL_COMMUNITY)
Admission: RE | Admit: 2012-06-18 | Discharge: 2012-06-18 | Disposition: A | Discharge status: Home / Self Care | Payer: 59 | Source: Ambulatory Visit | Attending: Orthopaedic Surgery | Admitting: Orthopaedic Surgery

## 2012-06-18 ENCOUNTER — Encounter (HOSPITAL_COMMUNITY): Payer: Self-pay

## 2012-06-18 DIAGNOSIS — Z01812 Encounter for preprocedural laboratory examination: Secondary | ICD-10-CM | POA: Insufficient documentation

## 2012-06-18 DIAGNOSIS — Z01818 Encounter for other preprocedural examination: Secondary | ICD-10-CM | POA: Insufficient documentation

## 2012-06-18 DIAGNOSIS — Z0183 Encounter for blood typing: Secondary | ICD-10-CM | POA: Insufficient documentation

## 2012-06-18 HISTORY — DX: Gastro-esophageal reflux disease without esophagitis: K21.9

## 2012-06-18 LAB — URINALYSIS, ROUTINE W REFLEX MICROSCOPIC
Bilirubin Urine: NEGATIVE
Ketones, ur: NEGATIVE mg/dL
Leukocytes, UA: NEGATIVE
Nitrite: NEGATIVE
Protein, ur: NEGATIVE mg/dL
Urobilinogen, UA: 0.2 mg/dL (ref 0.0–1.0)

## 2012-06-18 LAB — APTT: aPTT: 30 s (ref 24–37)

## 2012-06-18 LAB — BASIC METABOLIC PANEL
BUN: 12 mg/dL (ref 6–23)
CO2: 30 mEq/L (ref 19–32)
Calcium: 9.3 mg/dL (ref 8.4–10.5)
Creatinine, Ser: 0.78 mg/dL (ref 0.50–1.10)
GFR calc non Af Amer: 87 mL/min — ABNORMAL LOW (ref >90)
Glucose, Bld: 87 mg/dL (ref 70–99)
Sodium: 142 mEq/L (ref 135–145)

## 2012-06-18 LAB — TYPE AND SCREEN
ABO/RH(D): A POS
Antibody Screen: NEGATIVE

## 2012-06-18 LAB — CBC
MCH: 31.8 pg (ref 26.0–34.0)
MCHC: 33.9 g/dL (ref 30.0–36.0)
MCV: 93.8 fL (ref 78.0–100.0)
Platelets: 203 10*3/uL (ref 150–400)
RBC: 4.18 MIL/uL (ref 3.87–5.11)
RDW: 12.5 % (ref 11.5–15.5)

## 2012-06-18 LAB — SURGICAL PCR SCREEN
MRSA, PCR: NEGATIVE
Staphylococcus aureus: NEGATIVE

## 2012-06-18 NOTE — Pre-Procedure Instructions (Signed)
Tara Acosta  06/18/2012   Your procedure is scheduled on: Tuesday, June 26, 2012  Report to [facility] at 1:00 PM.  Call this number if you have problems the morning of surgery: [phone]   Remember:   Do not eat food or drink liquids after midnight.   Take these medicines the morning of surgery with A SIP OF WATER: if needed:Ranitidine HCl (ZANTAC 75 for acid reflux             Stop taking Aspirin and herbal medications ( GLUCOSAMINE HCL)             Do not take any NSAIDs ie:( Ibuprofen, Advil, Motrin) Naproxen or any medication containing Aspirin.   Do not wear jewelry, make-up or nail polish.  Do not wear lotions, powders, or perfumes. You may wear deodorant.  Do not shave 48 hours prior to surgery.   Do not bring valuables to the hospital.  [facility] is not responsible for any belongings or valuables.  Contacts, dentures or bridgework may not be worn into surgery.  Leave suitcase in the car. After surgery it may be brought to your room.  For patients admitted to the hospital, checkout time is 11:00 AM the day of discharge.   Patients discharged the day of surgery will not be allowed to drive home.  Name and phone number of your driver:   Special Instructions: Shower using CHG 2 nights before surgery and the night before surgery.  If you shower the day of surgery use CHG.  Use special wash - you have one bottle of CHG for all showers.  You should use approximately 1/3 of the bottle for each shower.   Please read over the following fact sheets that you were given: Pain Booklet, Coughing and Deep Breathing, Blood Transfusion Information, MRSA Information and Surgical Site Infection Prevention

## 2012-06-25 MED ORDER — CEFAZOLIN SODIUM-DEXTROSE 2-3 GM-% IV SOLR
2.0000 g | INTRAVENOUS | Status: AC
  Administered 2012-06-26: 2 g via INTRAVENOUS
  Filled 2012-06-25: qty 50

## 2012-06-26 ENCOUNTER — Encounter (HOSPITAL_COMMUNITY)
Admission: RE | Disposition: A | Discharge status: Home / Self Care | Payer: Self-pay | Source: Ambulatory Visit | Attending: Orthopaedic Surgery

## 2012-06-26 ENCOUNTER — Inpatient Hospital Stay (HOSPITAL_COMMUNITY)
Admission: RE | Admit: 2012-06-26 | Discharge: 2012-06-29 | DRG: 470 | Disposition: A | Discharge status: Home / Self Care | Payer: 59 | Source: Ambulatory Visit | Attending: Orthopaedic Surgery | Admitting: Orthopaedic Surgery

## 2012-06-26 ENCOUNTER — Encounter (HOSPITAL_COMMUNITY): Payer: Self-pay | Admitting: Anesthesiology

## 2012-06-26 ENCOUNTER — Ambulatory Visit (HOSPITAL_COMMUNITY): Payer: 59

## 2012-06-26 ENCOUNTER — Encounter (HOSPITAL_COMMUNITY): Payer: Self-pay

## 2012-06-26 ENCOUNTER — Ambulatory Visit (HOSPITAL_COMMUNITY): Payer: 59 | Admitting: Anesthesiology

## 2012-06-26 DIAGNOSIS — M169 Osteoarthritis of hip, unspecified: Principal | ICD-10-CM | POA: Diagnosis present

## 2012-06-26 DIAGNOSIS — K219 Gastro-esophageal reflux disease without esophagitis: Secondary | ICD-10-CM | POA: Diagnosis present

## 2012-06-26 DIAGNOSIS — M1610 Unilateral primary osteoarthritis, unspecified hip: Principal | ICD-10-CM | POA: Diagnosis present

## 2012-06-26 DIAGNOSIS — Z85820 Personal history of malignant melanoma of skin: Secondary | ICD-10-CM

## 2012-06-26 DIAGNOSIS — Z7982 Long term (current) use of aspirin: Secondary | ICD-10-CM

## 2012-06-26 DIAGNOSIS — D62 Acute posthemorrhagic anemia: Secondary | ICD-10-CM | POA: Diagnosis not present

## 2012-06-26 HISTORY — DX: Family history of other specified conditions: Z84.89

## 2012-06-26 HISTORY — PX: TOTAL HIP ARTHROPLASTY: SHX124

## 2012-06-26 SURGERY — ARTHROPLASTY, HIP, TOTAL, ANTERIOR APPROACH
Anesthesia: General | Site: Hip | Laterality: Right | Wound class: Clean

## 2012-06-26 MED ORDER — HYDROMORPHONE HCL PF 1 MG/ML IJ SOLN
1.0000 mg | INTRAMUSCULAR | Status: DC | PRN
  Administered 2012-06-27: 1 mg via INTRAVENOUS
  Filled 2012-06-26: qty 1

## 2012-06-26 MED ORDER — DOCUSATE SODIUM 100 MG PO CAPS
100.0000 mg | ORAL_CAPSULE | Freq: Two times a day (BID) | ORAL | Status: DC
  Administered 2012-06-26 – 2012-06-29 (×6): 100 mg via ORAL
  Filled 2012-06-26 (×8): qty 1

## 2012-06-26 MED ORDER — PROPOFOL 10 MG/ML IV BOLUS
INTRAVENOUS | Status: DC | PRN
  Administered 2012-06-26: 130 mg via INTRAVENOUS

## 2012-06-26 MED ORDER — MIDAZOLAM HCL 5 MG/5ML IJ SOLN
INTRAMUSCULAR | Status: DC | PRN
  Administered 2012-06-26: 2 mg via INTRAVENOUS

## 2012-06-26 MED ORDER — ONDANSETRON HCL 4 MG PO TABS
4.0000 mg | ORAL_TABLET | Freq: Four times a day (QID) | ORAL | Status: DC | PRN
  Administered 2012-06-27 – 2012-06-28 (×2): 4 mg via ORAL
  Filled 2012-06-26 (×2): qty 1

## 2012-06-26 MED ORDER — LIDOCAINE HCL (CARDIAC) 20 MG/ML IV SOLN
INTRAVENOUS | Status: DC | PRN
  Administered 2012-06-26: 50 mg via INTRAVENOUS

## 2012-06-26 MED ORDER — ONDANSETRON HCL 4 MG/2ML IJ SOLN
INTRAMUSCULAR | Status: AC
  Filled 2012-06-26: qty 2

## 2012-06-26 MED ORDER — METOCLOPRAMIDE HCL 5 MG/ML IJ SOLN: 5.0000 mg | Freq: Three times a day (TID) | INTRAMUSCULAR | Status: DC | PRN

## 2012-06-26 MED ORDER — ACETAMINOPHEN 650 MG RE SUPP: 650.0000 mg | Freq: Four times a day (QID) | RECTAL | Status: DC | PRN

## 2012-06-26 MED ORDER — ONDANSETRON HCL 4 MG/2ML IJ SOLN
INTRAMUSCULAR | Status: DC | PRN
  Administered 2012-06-26: 4 mg via INTRAVENOUS

## 2012-06-26 MED ORDER — METOCLOPRAMIDE HCL 5 MG PO TABS
5.0000 mg | ORAL_TABLET | Freq: Three times a day (TID) | ORAL | Status: DC | PRN
  Filled 2012-06-26: qty 2

## 2012-06-26 MED ORDER — FENTANYL CITRATE 0.05 MG/ML IJ SOLN
INTRAMUSCULAR | Status: DC | PRN
  Administered 2012-06-26: 100 ug via INTRAVENOUS
  Administered 2012-06-26 (×3): 50 ug via INTRAVENOUS

## 2012-06-26 MED ORDER — LACTATED RINGERS IV SOLN
INTRAVENOUS | Status: DC
  Administered 2012-06-26 (×3): via INTRAVENOUS

## 2012-06-26 MED ORDER — METHOCARBAMOL 500 MG PO TABS
500.0000 mg | ORAL_TABLET | Freq: Four times a day (QID) | ORAL | Status: DC | PRN
  Administered 2012-06-27 – 2012-06-29 (×4): 500 mg via ORAL
  Filled 2012-06-26 (×4): qty 1

## 2012-06-26 MED ORDER — METHOCARBAMOL 100 MG/ML IJ SOLN
500.0000 mg | Freq: Four times a day (QID) | INTRAVENOUS | Status: DC | PRN
  Filled 2012-06-26: qty 5

## 2012-06-26 MED ORDER — OXYCODONE HCL 5 MG/5ML PO SOLN: 5.0000 mg | Freq: Once | ORAL | Status: DC | PRN

## 2012-06-26 MED ORDER — OXYCODONE HCL 5 MG PO TABS
5.0000 mg | ORAL_TABLET | ORAL | Status: DC | PRN
  Administered 2012-06-27 (×2): 10 mg via ORAL
  Administered 2012-06-27: 5 mg via ORAL
  Filled 2012-06-26: qty 2
  Filled 2012-06-26: qty 1
  Filled 2012-06-26: qty 2

## 2012-06-26 MED ORDER — BISACODYL 10 MG RE SUPP: 10.0000 mg | Freq: Every day | RECTAL | Status: DC | PRN

## 2012-06-26 MED ORDER — CEFAZOLIN SODIUM 1-5 GM-% IV SOLN
1.0000 g | Freq: Four times a day (QID) | INTRAVENOUS | Status: AC
  Administered 2012-06-26 – 2012-06-27 (×2): 1 g via INTRAVENOUS
  Filled 2012-06-26 (×2): qty 50

## 2012-06-26 MED ORDER — ACETAMINOPHEN 325 MG PO TABS
650.0000 mg | ORAL_TABLET | Freq: Four times a day (QID) | ORAL | Status: DC | PRN
  Administered 2012-06-27 – 2012-06-28 (×2): 650 mg via ORAL
  Filled 2012-06-26 (×3): qty 2

## 2012-06-26 MED ORDER — PANTOPRAZOLE SODIUM 20 MG PO TBEC
20.0000 mg | DELAYED_RELEASE_TABLET | Freq: Every day | ORAL | Status: DC
  Administered 2012-06-26 – 2012-06-29 (×4): 20 mg via ORAL
  Filled 2012-06-26 (×4): qty 1

## 2012-06-26 MED ORDER — DIPHENHYDRAMINE HCL 12.5 MG/5ML PO ELIX: 12.5000 mg | ORAL_SOLUTION | ORAL | Status: DC | PRN

## 2012-06-26 MED ORDER — POLYETHYLENE GLYCOL 3350 17 G PO PACK: 17.0000 g | PACK | Freq: Every day | ORAL | Status: DC | PRN

## 2012-06-26 MED ORDER — PHENOL 1.4 % MT LIQD: 1.0000 | OROMUCOSAL | Status: DC | PRN

## 2012-06-26 MED ORDER — ONDANSETRON HCL 4 MG/2ML IJ SOLN: 4.0000 mg | Freq: Four times a day (QID) | INTRAMUSCULAR | Status: DC | PRN

## 2012-06-26 MED ORDER — LIDOCAINE HCL 4 % MT SOLN
OROMUCOSAL | Status: DC | PRN
  Administered 2012-06-26: 4 mL via TOPICAL

## 2012-06-26 MED ORDER — DEXAMETHASONE SODIUM PHOSPHATE 4 MG/ML IJ SOLN
INTRAMUSCULAR | Status: DC | PRN
  Administered 2012-06-26: 4 mg via INTRAVENOUS

## 2012-06-26 MED ORDER — PHENYLEPHRINE HCL 10 MG/ML IJ SOLN
INTRAMUSCULAR | Status: DC | PRN
  Administered 2012-06-26: 40 ug via INTRAVENOUS
  Administered 2012-06-26: 80 ug via INTRAVENOUS
  Administered 2012-06-26: 40 ug via INTRAVENOUS
  Administered 2012-06-26: 80 ug via INTRAVENOUS
  Administered 2012-06-26: 120 ug via INTRAVENOUS
  Administered 2012-06-26: 40 ug via INTRAVENOUS

## 2012-06-26 MED ORDER — 0.9 % SODIUM CHLORIDE (POUR BTL) OPTIME
TOPICAL | Status: DC | PRN
  Administered 2012-06-26: 1000 mL

## 2012-06-26 MED ORDER — ZOLPIDEM TARTRATE 5 MG PO TABS: 5.0000 mg | ORAL_TABLET | Freq: Every evening | ORAL | Status: DC | PRN

## 2012-06-26 MED ORDER — HYDROMORPHONE HCL PF 1 MG/ML IJ SOLN
INTRAMUSCULAR | Status: DC | PRN
  Administered 2012-06-26: 1 mg via INTRAVENOUS

## 2012-06-26 MED ORDER — SODIUM CHLORIDE 0.9 % IV SOLN
INTRAVENOUS | Status: DC
  Administered 2012-06-26: 21:00:00 via INTRAVENOUS

## 2012-06-26 MED ORDER — MENTHOL 3 MG MT LOZG: 1.0000 | LOZENGE | OROMUCOSAL | Status: DC | PRN

## 2012-06-26 MED ORDER — ONDANSETRON HCL 4 MG/2ML IJ SOLN
4.0000 mg | Freq: Once | INTRAMUSCULAR | Status: AC
  Administered 2012-06-26: 4 mg via INTRAVENOUS

## 2012-06-26 MED ORDER — ALUM & MAG HYDROXIDE-SIMETH 200-200-20 MG/5ML PO SUSP
30.0000 mL | ORAL | Status: DC | PRN
  Administered 2012-06-27: 30 mL via ORAL
  Filled 2012-06-26: qty 30

## 2012-06-26 MED ORDER — HYDROMORPHONE HCL PF 1 MG/ML IJ SOLN
0.2500 mg | INTRAMUSCULAR | Status: DC | PRN
  Administered 2012-06-26 (×2): 0.5 mg via INTRAVENOUS

## 2012-06-26 MED ORDER — GLYCOPYRROLATE 0.2 MG/ML IJ SOLN
INTRAMUSCULAR | Status: DC | PRN
  Administered 2012-06-26: 0.4 mg via INTRAVENOUS

## 2012-06-26 MED ORDER — ROCURONIUM BROMIDE 100 MG/10ML IV SOLN
INTRAVENOUS | Status: DC | PRN
  Administered 2012-06-26: 40 mg via INTRAVENOUS

## 2012-06-26 MED ORDER — OXYCODONE HCL 5 MG PO TABS: 5.0000 mg | ORAL_TABLET | Freq: Once | ORAL | Status: DC | PRN

## 2012-06-26 MED ORDER — SUCCINYLCHOLINE CHLORIDE 20 MG/ML IJ SOLN
INTRAMUSCULAR | Status: DC | PRN
  Administered 2012-06-26: 100 mg via INTRAVENOUS

## 2012-06-26 MED ORDER — ASPIRIN EC 325 MG PO TBEC
325.0000 mg | DELAYED_RELEASE_TABLET | Freq: Two times a day (BID) | ORAL | Status: DC
  Administered 2012-06-27 – 2012-06-29 (×5): 325 mg via ORAL
  Filled 2012-06-26 (×7): qty 1

## 2012-06-26 MED ORDER — SODIUM CHLORIDE 0.9 % IR SOLN
Status: DC | PRN
  Administered 2012-06-26: 3000 mL

## 2012-06-26 MED ORDER — FENTANYL CITRATE 0.05 MG/ML IJ SOLN: 25.0000 ug | INTRAMUSCULAR | Status: DC | PRN

## 2012-06-26 MED ORDER — HYDROMORPHONE HCL PF 1 MG/ML IJ SOLN
INTRAMUSCULAR | Status: AC
  Filled 2012-06-26: qty 1

## 2012-06-26 MED ORDER — NEOSTIGMINE METHYLSULFATE 1 MG/ML IJ SOLN
INTRAMUSCULAR | Status: DC | PRN
  Administered 2012-06-26: 3 mg via INTRAVENOUS

## 2012-06-26 SURGICAL SUPPLY — 54 items
ADH SKN CLS LQ APL DERMABOND (GAUZE/BANDAGES/DRESSINGS) ×1
BANDAGE GAUZE ELAST BULKY 4 IN (GAUZE/BANDAGES/DRESSINGS) IMPLANT
BLADE SAW SGTL 18X1.27X75 (BLADE) ×2 IMPLANT
BLADE SURG ROTATE 9660 (MISCELLANEOUS) IMPLANT
CAPT HIP PF COP ×1 IMPLANT
CELLS DAT CNTRL 66122 CELL SVR (MISCELLANEOUS) ×1 IMPLANT
CLOTH BEACON ORANGE TIMEOUT ST (SAFETY) ×2 IMPLANT
COVER BACK TABLE 24X17X13 BIG (DRAPES) IMPLANT
COVER SURGICAL LIGHT HANDLE (MISCELLANEOUS) ×2 IMPLANT
DERMABOND ADHESIVE PROPEN (GAUZE/BANDAGES/DRESSINGS) ×1
DERMABOND ADVANCED .7 DNX6 (GAUZE/BANDAGES/DRESSINGS) ×1 IMPLANT
DRAPE C-ARM 42X72 X-RAY (DRAPES) ×2 IMPLANT
DRAPE STERI IOBAN 125X83 (DRAPES) ×2 IMPLANT
DRAPE U-SHAPE 47X51 STRL (DRAPES) ×6 IMPLANT
DRSG AQUACEL AG ADV 3.5X10 (GAUZE/BANDAGES/DRESSINGS) ×2 IMPLANT
DURAPREP 26ML APPLICATOR (WOUND CARE) ×2 IMPLANT
ELECT BLADE 4.0 EZ CLEAN MEGAD (MISCELLANEOUS)
ELECT BLADE TIP CTD 4 INCH (ELECTRODE) ×2 IMPLANT
ELECT CAUTERY BLADE 6.4 (BLADE) ×2 IMPLANT
ELECT REM PT RETURN 9FT ADLT (ELECTROSURGICAL) ×2
ELECTRODE BLDE 4.0 EZ CLN MEGD (MISCELLANEOUS) IMPLANT
ELECTRODE REM PT RTRN 9FT ADLT (ELECTROSURGICAL) ×1 IMPLANT
FACESHIELD LNG OPTICON STERILE (SAFETY) ×4 IMPLANT
GAUZE XEROFORM 1X8 LF (GAUZE/BANDAGES/DRESSINGS) ×2 IMPLANT
GLOVE BIOGEL PI IND STRL 8 (GLOVE) ×2 IMPLANT
GLOVE BIOGEL PI INDICATOR 8 (GLOVE) ×2
GLOVE ECLIPSE 8.0 STRL XLNG CF (GLOVE) ×2 IMPLANT
GLOVE SURG ORTHO 8.0 STRL STRW (GLOVE) ×2 IMPLANT
GOWN STRL REIN XL XLG (GOWN DISPOSABLE) ×4 IMPLANT
HANDPIECE INTERPULSE COAX TIP (DISPOSABLE) ×2
KIT BASIN OR (CUSTOM PROCEDURE TRAY) ×2 IMPLANT
KIT ROOM TURNOVER OR (KITS) ×2 IMPLANT
MANIFOLD NEPTUNE II (INSTRUMENTS) ×2 IMPLANT
NS IRRIG 1000ML POUR BTL (IV SOLUTION) ×2 IMPLANT
PACK TOTAL JOINT (CUSTOM PROCEDURE TRAY) ×2 IMPLANT
PAD ARMBOARD 7.5X6 YLW CONV (MISCELLANEOUS) ×4 IMPLANT
RETRACTOR WND ALEXIS 18 MED (MISCELLANEOUS) ×1 IMPLANT
RTRCTR WOUND ALEXIS 18CM MED (MISCELLANEOUS) ×2
SET HNDPC FAN SPRY TIP SCT (DISPOSABLE) ×1 IMPLANT
SPONGE LAP 18X18 X RAY DECT (DISPOSABLE) IMPLANT
SPONGE LAP 4X18 X RAY DECT (DISPOSABLE) IMPLANT
STAPLER VISISTAT 35W (STAPLE) ×2 IMPLANT
SUT ETHIBOND NAB CT1 #1 30IN (SUTURE) ×4 IMPLANT
SUT MNCRL AB 3-0 PS2 27 (SUTURE) ×2 IMPLANT
SUT VIC AB 0 CT1 27 (SUTURE) ×4
SUT VIC AB 0 CT1 27XBRD ANBCTR (SUTURE) ×2 IMPLANT
SUT VIC AB 1 CT1 27 (SUTURE) ×4
SUT VIC AB 1 CT1 27XBRD ANBCTR (SUTURE) ×2 IMPLANT
SUT VIC AB 2-0 CT1 27 (SUTURE) ×4
SUT VIC AB 2-0 CT1 TAPERPNT 27 (SUTURE) ×2 IMPLANT
TOWEL OR 17X24 6PK STRL BLUE (TOWEL DISPOSABLE) ×2 IMPLANT
TOWEL OR 17X26 10 PK STRL BLUE (TOWEL DISPOSABLE) ×2 IMPLANT
TRAY FOLEY CATH 14FR (SET/KITS/TRAYS/PACK) IMPLANT
WATER STERILE IRR 1000ML POUR (IV SOLUTION) ×4 IMPLANT

## 2012-06-26 NOTE — Preoperative (Signed)
Beta Blockers   Reason not to administer Beta Blockers:Not Applicable 

## 2012-06-26 NOTE — Anesthesia Postprocedure Evaluation (Signed)
Anesthesia Post Note  Patient: Tara Acosta  Procedure(s) Performed: Procedure(s) (LRB): RIGHT TOTAL HIP ARTHROPLASTY ANTERIOR APPROACH (Right)  Anesthesia type: General  Patient location: PACU  Post pain: Pain level controlled and Adequate analgesia  Post assessment: Post-op Vital signs reviewed, Patient's Cardiovascular Status Stable, Respiratory Function Stable, Patent Airway and Pain level controlled  Last Vitals:  Filed Vitals:   06/26/12 1800  BP:   Pulse: 68  Temp:   Resp: 9    Post vital signs: Reviewed and stable  Level of consciousness: awake, alert  and oriented  Complications: No apparent anesthesia complications

## 2012-06-26 NOTE — H&P (Signed)
TOTAL HIP ADMISSION H&P  Patient is admitted for right total hip arthroplasty.  Subjective:  Chief Complaint: right hip pain  HPI: Tara Acosta, 62 y.o. female, has a history of pain and functional disability in the right hip(s) due to arthritis and patient has failed non-surgical conservative treatments for greater than 12 weeks to include NSAID's and/or analgesics, corticosteriod injections, use of assistive devices and activity modification.  Onset of symptoms was gradual starting 5 years ago with gradually worsening course since that time.The patient noted no past surgery on the right hip(s).  Patient currently rates pain in the right hip at 9 out of 10 with activity. Patient has night pain, worsening of pain with activity and weight bearing, trendelenberg gait, pain that interfers with activities of daily living and pain with passive range of motion. Patient has evidence of subchondral sclerosis, periarticular osteophytes and joint space narrowing by imaging studies. This condition presents safety issues increasing the risk of falls.  There is no current active infection.  Patient Active Problem List   Diagnosis Date Noted  . Degenerative arthritis of hip 06/26/2012  . History of melanoma 10/03/2011  . GERD (gastroesophageal reflux disease) 09/29/2010  . 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 OSTEOARTHRITIS 02/06/2007  . NEPHROLITHIASIS, HX OF 02/06/2007   Past Medical History  Diagnosis Date  . NEPHROLITHIASIS, HX OF 02/06/2007  . OSTEOARTHRITIS 02/06/2007  . 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 GERD (gastroesophageal reflux disease)     Past Surgical History  Procedure Laterality Date  . Tubal ligation    . Colonoscopy  2009    No prescriptions prior to admission   No Known Allergies  History  Substance Use Topics  . Smoking status: Never Smoker   . Smokeless tobacco: Never Used  . Alcohol Use: No     Comment: social    Family History  Problem Relation Age of Onset  . Gout Father   .  Hypertension Father   . Dementia Father   . Hypertension Mother   . Coronary artery disease Mother   . Pancreatic cancer      Uncle     Review of Systems  All other systems reviewed and are negative.    Objective:  Physical Exam  Constitutional: She is oriented to person, place, and time. She appears well-developed and well-nourished.  HENT:  Head: Normocephalic and atraumatic.  Eyes: EOM are normal. Pupils are equal, round, and reactive to light.  Neck: Normal range of motion. Neck supple.  Cardiovascular: Normal rate and regular rhythm.   Respiratory: Effort normal and breath sounds normal.  GI: Soft. Bowel sounds are normal.  Musculoskeletal:       Right hip: She exhibits decreased range of motion, decreased strength, bony tenderness and crepitus.  Neurological: She is alert and oriented to person, place, and time.  Skin: Skin is warm and dry.  Psychiatric: She has a normal mood and affect.    Vital signs in last 24 hours:    Labs:   Estimated body mass index is 23.96 kg/(m^2) as calculated from the following:   Height as of 10/03/11: 5' 8.75" (1.746 m).   Weight as of 05/03/12: 73.029 kg (161 lb).   Imaging Review Plain radiographs demonstrate moderate degenerative joint disease of the right hip(s). The bone quality appears to be good for age and reported activity level.  Assessment/Plan:  End stage arthritis, right hip(s)  The patient history, physical examination, clinical judgement of the provider and imaging studies are consistent with end  stage degenerative joint disease of the right hip(s) and total hip arthroplasty is deemed medically necessary. The treatment options including medical management, injection therapy, arthroscopy and arthroplasty were discussed at length. The risks and benefits of total hip arthroplasty were presented and reviewed. The risks due to aseptic loosening, infection, stiffness, dislocation/subluxation,  thromboembolic complications and  other imponderables were discussed.  The patient acknowledged the explanation, agreed to proceed with the plan and consent was signed. Patient is being admitted for inpatient treatment for surgery, pain control, PT, OT, prophylactic antibiotics, VTE prophylaxis, progressive ambulation and ADL's and discharge planning.The patient is planning to be discharged home with home health services

## 2012-06-26 NOTE — Transfer of Care (Signed)
Immediate Anesthesia Transfer of Care Note  Patient: Tara Acosta  Procedure(s) Performed: Procedure(s): RIGHT TOTAL HIP ARTHROPLASTY ANTERIOR APPROACH (Right)  Patient Location: PACU  Anesthesia Type:General  Level of Consciousness: awake, alert , oriented and patient cooperative  Airway & Oxygen Therapy: Patient Spontanous Breathing and Patient connected to nasal cannula oxygen  Post-op Assessment: Report given to PACU RN, Post -op Vital signs reviewed and stable and Patient moving all extremities X 4  Post vital signs: Reviewed and stable  Complications: No apparent anesthesia complications

## 2012-06-26 NOTE — Brief Op Note (Signed)
06/26/2012  5:22 PM  PATIENT:  Tara Acosta  62 y.o. female  PRE-OPERATIVE DIAGNOSIS:  Severe osteoarthritis right hip  POST-OPERATIVE DIAGNOSIS:  severe osteoarthritis right hip  PROCEDURE:  Procedure(s): RIGHT TOTAL HIP ARTHROPLASTY ANTERIOR APPROACH (Right)  SURGEON:  Surgeon(s) and Role:    * Kathryne Hitch, MD - Primary  PHYSICIAN ASSISTANT:   ASSISTANTS: none   ANESTHESIA:   general  EBL:  Total I/O In: 2000 [I.V.:2000] Out: 400 [Urine:200; Blood:200]  BLOOD ADMINISTERED:none  DRAINS: none   LOCAL MEDICATIONS USED:  NONE  SPECIMEN:  No Specimen  DISPOSITION OF SPECIMEN:  N/A  COUNTS:  YES  TOURNIQUET:  * No tourniquets in log *  DICTATION: .Other Dictation: Dictation Number [id number]  PLAN OF CARE: Admit to inpatient   PATIENT DISPOSITION:  PACU - hemodynamically stable.   Delay start of Pharmacological VTE agent (>24hrs) due to surgical blood loss or risk of bleeding: not applicable

## 2012-06-26 NOTE — Op Note (Signed)
NAMEKOREY, ARROYO NO.:  [id number]  MEDICAL RECORD NO.:  [id number]  LOCATION:  [id number]                        FACILITY:  [facility]  PHYSICIAN:  Vanita Panda. Magnus Ivan, M.D.DATE OF BIRTH:  March 02, 1950  DATE OF PROCEDURE:  06/26/2012 DATE OF DISCHARGE:                              OPERATIVE REPORT   PREOPERATIVE DIAGNOSIS:  End-stage arthritis and degenerative joint disease, right hip.  POSTOPERATIVE DIAGNOSIS:  End-stage arthritis and degenerative joint disease, right hip.  PROCEDURE:  Right total hip arthroplasty through direct anterior approach.  IMPLANTS:  DePuy Sector Gription acetabular component size 50, size 32+ 4 neutral polyethylene liner, size 10 Corail femoral component with Varus offset, size 32+ 1 ceramic hip ball.  SURGEON:  Vanita Panda. Magnus Ivan, M.D.  ANESTHESIA:  General.  BLOOD LOSS:  200 mL.  ANTIBIOTICS:  2 g of IV Ancef.  COMPLICATIONS:  None.  INDICATIONS:  Ms. Kubota is a 62 year old female with severe end-stage arthritis involving her right hip.  She has daily limp.  She has daily pain.  Her activities of daily living and her diminishing ability is greatly affected.  She has failed conservative treatment and at this point, wish to proceed with a total hip arthroplasty.  The risks and benefits of surgery were explained to her in detail and she does wish to proceed.  PROCEDURE DESCRIPTION:  After informed consent was obtained, appropriate right hip was marked.  She was brought to the operating room and general anesthesia was obtained while she was on her stretcher.  A Foley catheter was placed and then both feet had traction boots applied to them.  She was next placed supine on the Hana fracture table with perineal post in place and both legs in inline skeletal traction, but no traction applied.  Time-out was called and she was identified as correct patient and correct right hip.  We then made an incision just posterior and  inferior to the anterosuperior iliac spine and carried this obliquely down the leg.  We dissected down to the tensor fascia lata muscle and the tensor fascia was then divided longitudinally.  I then proceeded with a direct anterior approach to the hip.  A Cobra retractor was placed around the lateral neck and up underneath the rectus femoris. A medial Cobra retractor was placed.  I cauterized the lateral femoral circumflex vessels and I then opened up the hip capsule in a L-type format, placing the Cobra retractors within the hip capsule.  I then made my femoral neck cut just proximal to the lesser trochanter using oscillating saw and completed this with an osteotome.  I placed a Corkscrew guide in the femoral head and removed the femoral head in its entirety.  I found that to be devoid of cartilage.  I then cleaned the acetabular debris.  I placed a bent Hohmann medially and Cobra retractor laterally.  I then began reaming from a size 42 reamer and 2-mm increments up to a size 50 with all reamers placed under direct visualization.  The last reamer was placed under direct fluoroscopy.  I then placed the real DePuy Sector Gription acetabular component under direct visualization and direct fluoroscopy, so be obtain my depth of  placing it, my inclination and anteversion.  Once I was pleased with this, I placed the real 32+ 4 neutral polyethylene liner.  Attention was then turned to the femur with the leg externally rotated to 90 degrees, extended and adducted.  I placed a Mueller retractor medially and a bent Hohmann behind the greater trochanter.  I released the lateral joint capsule and then used a rongeur to lateralize and our box cutting guide to open up the femoral canal, then began broaching from a size 8 broach up to a size 10.  The size 10 was felt to be stable and I trialed a standard neck followed by Varus neck and a 32+ 1 hip ball and we got her leg lengths to where she was just  slightly long, but I think that she was slightly long starting off as well.  She does have disease in her other hip.  We then dislocated the hip and removed the trial components. I placed the real Corail femoral component size 10 with a Varus offset and the real 32+ 1 ceramic hip ball.  We reduced this back in the acetabulum and again it was stable.  We verified this further rotation and fluoroscopy.  We then copiously irrigated the hip with normal saline solution using pulsatile lavage.  We closed the joint capsule with interrupted #1 Ethibond suture followed by running #1 Vicryl in the tensor fascia, 2-0 Vicryl in the deep and subcutaneous tissue, 4-0 Monocryl subcuticular stitch, and Dermabond on the skin.  A well-padded Aquacel dressing was then applied.  She was taken off the Hana table, awakened, extubated, and taken to the recovery room in stable condition. All final counts were correct.  There were no complications noted.     Vanita Panda. Magnus Ivan, M.D.     CYB/MEDQ  D:  06/26/2012  T:  06/26/2012  Job:  [id number]

## 2012-06-26 NOTE — Anesthesia Procedure Notes (Signed)
Procedure Name: Intubation Date/Time: 06/26/2012 3:16 PM Performed by: Leona Singleton A Pre-anesthesia Checklist: Patient identified, Emergency Drugs available, Suction available and Patient being monitored Patient Re-evaluated:Patient Re-evaluated prior to inductionOxygen Delivery Method: Circle system utilized Preoxygenation: Pre-oxygenation with 100% oxygen Intubation Type: IV induction, Rapid sequence and Cricoid Pressure applied Laryngoscope Size: Miller and 2 Grade View: Grade I Tube type: Oral Tube size: 7.0 mm Number of attempts: 1 Airway Equipment and Method: Stylet and LTA kit utilized Placement Confirmation: ETT inserted through vocal cords under direct vision,  positive ETCO2 and breath sounds checked- equal and bilateral Secured at: 22 cm Tube secured with: Tape Dental Injury: Teeth and Oropharynx as per pre-operative assessment

## 2012-06-26 NOTE — Anesthesia Preprocedure Evaluation (Addendum)
Anesthesia Evaluation  Patient identified by MRN, date of birth, ID band Patient awake    Reviewed: Allergy & Precautions, H&P , NPO status , Patient's Chart, lab work & pertinent test results  History of Anesthesia Complications (+) Family history of anesthesia reaction  Airway Mallampati: I TM Distance: >3 FB Neck ROM: Full    Dental no notable dental hx. (+) Teeth Intact and Dental Advisory Given   Pulmonary neg pulmonary ROS,  breath sounds clear to auscultation  Pulmonary exam normal       Cardiovascular negative cardio ROS  Rhythm:Regular Rate:Normal     Neuro/Psych negative neurological ROS  negative psych ROS   GI/Hepatic negative GI ROS, Neg liver ROS, GERD-  Medicated and Controlled,  Endo/Other  negative endocrine ROS  Renal/GU negative Renal ROS  negative genitourinary   Musculoskeletal  (+) Arthritis -, Osteoarthritis,    Abdominal   Peds  Hematology negative hematology ROS (+)   Anesthesia Other Findings   Reproductive/Obstetrics negative OB ROS                          Anesthesia Physical Anesthesia Plan  ASA: II  Anesthesia Plan: General   Post-op Pain Management:    Induction: Intravenous  Airway Management Planned: Oral ETT  Additional Equipment:   Intra-op Plan:   Post-operative Plan: Extubation in OR  Informed Consent: I have reviewed the patients History and Physical, chart, labs and discussed the procedure including the risks, benefits and alternatives for the proposed anesthesia with the patient or authorized representative who has indicated his/her understanding and acceptance.   Dental advisory given  Plan Discussed with: CRNA  Anesthesia Plan Comments:         Anesthesia Quick Evaluation

## 2012-06-27 LAB — CBC
MCHC: 32.9 g/dL (ref 30.0–36.0)
Platelets: 177 10*3/uL (ref 150–400)
RDW: 12.3 % (ref 11.5–15.5)
WBC: 6.8 10*3/uL (ref 4.0–10.5)

## 2012-06-27 LAB — BASIC METABOLIC PANEL
Chloride: 102 mEq/L (ref 96–112)
GFR calc Af Amer: >90 mL/min (ref >90)
GFR calc non Af Amer: >90 mL/min (ref >90)
Potassium: 4.3 mEq/L (ref 3.5–5.1)

## 2012-06-27 NOTE — Progress Notes (Signed)
Physical Therapy Treatment Patient Details Name: Tara Acosta MRN: [id number] DOB: 1950-06-09 Today's Date: 06/27/2012 Time: 7829-5621 PT Time Calculation (min): 20 min  PT Assessment / Plan / Recommendation  PT Comments   Making great progress with incr activity tolerance compared with this am; plan for steps next session; On track for dc home tomorrow  Follow Up Recommendations  Home health PT;Supervision/Assistance - 24 hour     Does the patient have the potential to tolerate intense rehabilitation     Barriers to Discharge        Equipment Recommendations  Rolling walker with 5" wheels;3in1 (PT) (3in1)    Recommendations for Other Services    Frequency 7X/week   Progress towards PT Goals Progress towards PT goals: Progressing toward goals  Plan Current plan remains appropriate    Precautions / Restrictions Precautions Precautions: None Restrictions Weight Bearing Restrictions: Yes RLE Weight Bearing: Weight bearing as tolerated   Pertinent Vitals/Pain Burning a little less; Still sore; did not rate pain, but stated her R knee was feeling sore as well patient repositioned for comfort     Mobility  Bed Mobility Bed Mobility: Sit to Supine Supine to Sit: 4: Min guard;With rails Sit to Supine: 4: Min guard;HOB flat (bed elevated to approximate home) Details for Bed Mobility Assistance: Verbal and demo Cues for technique Transfers Transfers: Sit to Stand;Stand to Sit Sit to Stand: 4: Min guard;From chair/3-in-1;With upper extremity assist Stand to Sit: 4: Min guard;To chair/3-in-1;With armrests;To bed Details for Transfer Assistance: Cues for safety, hand placement; no need for physical contact Ambulation/Gait Ambulation/Gait Assistance: 4: Min guard Ambulation Distance (Feet): 80 Feet Assistive device: Rolling walker Ambulation/Gait Assistance Details: Better activity tolerance, incr amb distance; emerging step-through pattern Gait Pattern: Step-through pattern     Exercises Total Joint Exercises Ankle Circles/Pumps: AROM;Both;10 reps Quad Sets: AROM;Both;10 reps Gluteal Sets: AROM;Both;10 reps Towel Squeeze: AROM;Both;10 reps Heel Slides: AROM;Right;10 reps Hip ABduction/ADduction: AAROM;Right;10 reps   PT Diagnosis: Difficulty walking;Acute pain  PT Problem List: Decreased strength;Decreased range of motion;Decreased activity tolerance;Decreased mobility;Decreased knowledge of use of DME;Pain PT Treatment Interventions: DME instruction;Gait training;Stair training;Functional mobility training;Therapeutic activities;Therapeutic exercise;Patient/family education   PT Goals (current goals can now be found in the care plan section) Acute Rehab PT Goals Patient Stated Goal: Be able to stoop without pain PT Goal Formulation: With patient Time For Goal Achievement: 07/04/12 Potential to Achieve Goals: Good Additional Goals Additional Goal #1: Pt will perform therex with supervision  Visit Information  Last PT Received On: 06/27/12 Assistance Needed: +1 History of Present Illness: S/p RTHA direct anterior approach    Subjective Data  Subjective: Feels a little better than this am Patient Stated Goal: Be able to stoop without pain   Cognition  Cognition Arousal/Alertness: Awake/alert Behavior During Therapy: WFL for tasks assessed/performed Overall Cognitive Status: Within Functional Limits for tasks assessed    Balance     End of Session PT - End of Session Activity Tolerance: Patient tolerated treatment well (limited by nausea) Patient left: with call bell/phone within reach;with family/visitor present;in bed Nurse Communication: Mobility status   GP     Olen Pel [place], [place] [phone]   06/27/2012, 1:43 PM

## 2012-06-27 NOTE — Progress Notes (Signed)
Subjective: 1 Day Post-Op Procedure(s) (LRB): RIGHT TOTAL HIP ARTHROPLASTY ANTERIOR APPROACH (Right) Patient reports pain as moderate.  Asymptomatic acute blood loss anemia.  Objective: Vital signs in last 24 hours: Temp:  [97.5 F (36.4 C)-98.7 F (37.1 C)] 97.9 F (36.6 C) (06/25 0647) Pulse Rate:  [63-85] 85 (06/25 0647) Resp:  [9-21] 18 (06/25 0647) BP: (101-132)/(47-70) 122/51 mmHg (06/25 0647) SpO2:  [98 %-100 %] 100 % (06/25 0647)  Intake/Output from previous day: 06/24 0701 - 06/25 0700 In: 2000 [I.V.:2000] Out: 1575 [Urine:1375; Blood:200] Intake/Output this shift:     Recent Labs  06/27/12 0458  HGB 10.6*    Recent Labs  06/27/12 0458  WBC 6.8  RBC 3.48*  HCT 32.2*  PLT 177    Recent Labs  06/27/12 0458  NA 136  K 4.3  CL 102  CO2 28  BUN 12  CREATININE 0.67  GLUCOSE 145*  CALCIUM 8.0*   No results found for this basename: LABPT, INR,  in the last 72 hours  Sensation intact distally Intact pulses distally Dorsiflexion/Plantar flexion intact Incision: dressing C/D/I  Assessment/Plan: 1 Day Post-Op Procedure(s) (LRB): RIGHT TOTAL HIP ARTHROPLASTY ANTERIOR APPROACH (Right) Up with therapy  Tara Acosta Y 06/27/2012, 7:47 AM

## 2012-06-27 NOTE — Progress Notes (Signed)
UR COMPLETED  

## 2012-06-27 NOTE — Evaluation (Signed)
Physical Therapy Evaluation Patient Details Name: Tara Acosta MRN: [id number] DOB: January 14, 1950 Today's Date: 06/27/2012 Time: 4098-1191 PT Time Calculation (min): 24 min  PT Assessment / Plan / Recommendation Clinical Impression  Pt is s/p THA resulting in the deficits listed below (see PT Problem List).  Pt will benefit from skilled PT to increase their independence and safety with mobility to allow discharge home with husband assist prn       PT Assessment  Patient needs continued PT services    Follow Up Recommendations  Home health PT;Supervision/Assistance - 24 hour    Does the patient have the potential to tolerate intense rehabilitation      Barriers to Discharge        Equipment Recommendations  Rolling walker with 5" wheels (3in1)    Recommendations for Other Services     Frequency 7X/week    Precautions / Restrictions Precautions Precautions: None Restrictions Weight Bearing Restrictions: Yes RLE Weight Bearing: Weight bearing as tolerated   Pertinent Vitals/Pain Describes pain as burning; did not rate Was premedicated for pain      Mobility  Bed Mobility Bed Mobility: Supine to Sit Supine to Sit: 4: Min guard;With rails Details for Bed Mobility Assistance: Cues for technique Transfers Transfers: Sit to Stand;Stand to Sit Sit to Stand: 4: Min guard;From bed;From chair/3-in-1;With upper extremity assist Stand to Sit: 4: Min guard;To chair/3-in-1;With armrests Details for Transfer Assistance: Cues for safety, hand placement Ambulation/Gait Ambulation/Gait Assistance: 4: Min guard Ambulation Distance (Feet): 20 Feet Assistive device: Rolling walker Ambulation/Gait Assistance Details: Cues for gait sequence; Overall moving well -- amb stopped secondary to nausea Gait Pattern: Step-to pattern    Exercises     PT Diagnosis: Difficulty walking;Acute pain  PT Problem List: Decreased strength;Decreased range of motion;Decreased activity  tolerance;Decreased mobility;Decreased knowledge of use of DME;Pain PT Treatment Interventions: DME instruction;Gait training;Stair training;Functional mobility training;Therapeutic activities;Therapeutic exercise;Patient/family education     PT Goals(Current goals can be found in the care plan section) Acute Rehab PT Goals Patient Stated Goal: Be able to stoop without pain PT Goal Formulation: With patient Time For Goal Achievement: 07/04/12 Potential to Achieve Goals: Good Additional Goals Additional Goal #1: Pt will perform therex with supervision  Visit Information  Last PT Received On: 06/27/12 Assistance Needed: +1 History of Present Illness: S/p RTHA direct anterior approach       Prior Functioning  Home Living Family/patient expects to be discharged to:: Private residence Living Arrangements: Spouse/significant other Available Help at Discharge: Spouse/Significant other;Available 24 hours/day Type of Home: House Home Access: Stairs to enter [employer] of Steps: 2 (1 to porch, 1 into house) Entrance Stairs-Rails: None Home Layout: One level Home Equipment: Other (comment) (Is considering using her father's RW) Prior Function Level of Independence: Independent Communication Communication: No difficulties    Cognition  Cognition Arousal/Alertness: Awake/alert Behavior During Therapy: WFL for tasks assessed/performed Overall Cognitive Status: Within Functional Limits for tasks assessed    Extremity/Trunk Assessment Upper Extremity Assessment Upper Extremity Assessment: Overall WFL for tasks assessed Lower Extremity Assessment Lower Extremity Assessment: RLE deficits/detail;LLE deficits/detail RLE Deficits / Details: Grossly decr AROM and strength, limited by apin postop RLE: Unable to fully assess due to pain LLE Deficits / Details: Report she is concerned about L hip arthritis/soreness   Balance    End of Session PT - End of Session Activity  Tolerance:  (limited by nausea) Patient left: in chair;with call bell/phone within reach;with family/visitor present Nurse Communication: Mobility status  GP  Van Clines [place], [place] [phone]  06/27/2012, 10:26 AM

## 2012-06-28 LAB — CBC
MCH: 30.8 pg (ref 26.0–34.0)
MCHC: 32.9 g/dL (ref 30.0–36.0)
Platelets: 152 10*3/uL (ref 150–400)

## 2012-06-28 MED ORDER — HYDROCODONE-ACETAMINOPHEN 5-325 MG PO TABS
1.0000 | ORAL_TABLET | ORAL | Status: DC | PRN
  Administered 2012-06-28 – 2012-06-29 (×3): 1 via ORAL
  Filled 2012-06-28 (×3): qty 1

## 2012-06-28 MED ORDER — TRAMADOL HCL 50 MG PO TABS: 50.0000 mg | ORAL_TABLET | Freq: Four times a day (QID) | ORAL | Status: DC | PRN

## 2012-06-28 NOTE — Progress Notes (Signed)
Physical Therapy Treatment Patient Details Name: Tara Acosta MRN: [id number] DOB: Oct 25, 1950 Today's Date: 06/28/2012 Time: 3235-5732 PT Time Calculation (min): 25 min  PT Assessment / Plan / Recommendation  PT Comments   Patient is making good progress with PT.  From a mobility standpoint anticipate patient will be ready for DC home tomorrow per MD note .  Patient needs to practice stairs next session.      Follow Up Recommendations  Home health PT;Supervision/Assistance - 24 hour     Does the patient have the potential to tolerate intense rehabilitation     Barriers to Discharge        Equipment Recommendations  Rolling walker with 5" wheels;3in1 (PT)    Recommendations for Other Services    Frequency 7X/week   Progress towards PT Goals Progress towards PT goals: Progressing toward goals  Plan Current plan remains appropriate    Precautions / Restrictions Precautions Precautions: None Restrictions RLE Weight Bearing: Weight bearing as tolerated   Pertinent Vitals/Pain no apparent distress     Mobility  Bed Mobility Supine to Sit: 4: Min assist Details for Bed Mobility Assistance: Min A needed for R LE this morning Transfers Sit to Stand: 5: Supervision;From bed;From chair/3-in-1 Stand to Sit: 5: Supervision;To chair/3-in-1 Details for Transfer Assistance: Cues for safety, hand placement Ambulation/Gait Ambulation/Gait Assistance: 5: Supervision Ambulation Distance (Feet): 200 Feet Assistive device: Rolling walker Gait Pattern: Step-through pattern;Decreased stride length Gait velocity: decreased    Exercises Total Joint Exercises Quad Sets: AROM;Both;10 reps Heel Slides: AROM;Right;10 reps Hip ABduction/ADduction: AAROM;Right;10 reps Long Arc Quad: AROM;Right;10 reps   PT Diagnosis:    PT Problem List:   PT Treatment Interventions:     PT Goals (current goals can now be found in the care plan section)    Visit Information  Last PT Received On:  06/28/12 Assistance Needed: +1 History of Present Illness: S/p RTHA direct anterior approach    Subjective Data      Cognition  Cognition Arousal/Alertness: Awake/alert Behavior During Therapy: WFL for tasks assessed/performed Overall Cognitive Status: Within Functional Limits for tasks assessed    Balance     End of Session PT - End of Session Equipment Utilized During Treatment: Gait belt Activity Tolerance: Patient tolerated treatment well Patient left: in chair;with call bell/phone within reach Nurse Communication: Mobility status   GP     Fredrich Birks 06/28/2012, 8:59 AM 06/28/2012 Fredrich Birks PTA [phone] pager [phone] office

## 2012-06-28 NOTE — Progress Notes (Signed)
Subjective: 2 Days Post-Op Procedure(s) (LRB): RIGHT TOTAL HIP ARTHROPLASTY ANTERIOR APPROACH (Right) Patient reports pain as moderate.  Asymptomatic acute blood loss anemia.  Objective: Vital signs in last 24 hours: Temp:  [98.9 F (37.2 C)-99.1 F (37.3 C)] 99 F (37.2 C) (06/26 0513) Pulse Rate:  [90-106] 106 (06/26 0513) Resp:  [16] 16 (06/26 0513) BP: (98-115)/(52-62) 115/60 mmHg (06/26 0513) SpO2:  [95 %-98 %] 96 % (06/26 0513)  Intake/Output from previous day:   Intake/Output this shift:     Recent Labs  06/27/12 0458 06/28/12 0505  HGB 10.6* 9.9*    Recent Labs  06/27/12 0458 06/28/12 0505  WBC 6.8 5.7  RBC 3.48* 3.21*  HCT 32.2* 30.1*  PLT 177 152    Recent Labs  06/27/12 0458  NA 136  K 4.3  CL 102  CO2 28  BUN 12  CREATININE 0.67  GLUCOSE 145*  CALCIUM 8.0*   No results found for this basename: LABPT, INR,  in the last 72 hours  Sensation intact distally Intact pulses distally Dorsiflexion/Plantar flexion intact Incision: dressing C/D/I No cellulitis present  Assessment/Plan: 2 Days Post-Op Procedure(s) (LRB): RIGHT TOTAL HIP ARTHROPLASTY ANTERIOR APPROACH (Right) Plan for discharge tomorrow  Tara Acosta 06/28/2012, 12:00 PM

## 2012-06-28 NOTE — Progress Notes (Signed)
Physical Therapy Treatment Patient Details Name: Tara Acosta MRN: [id number] DOB: [birth year] Today's Date: 06/28/2012 Time: 2130-8657 PT Time Calculation (min): 23 min  PT Assessment / Plan / Recommendation  PT Comments   Patient progressing with ambulation this afternoon. Patient educated on step training but did not practice. Patient needs to practice stairs next session.     Follow Up Recommendations        Does the patient have the potential to tolerate intense rehabilitation     Barriers to Discharge        Equipment Recommendations  Rolling walker with 5" wheels;3in1 (PT)    Recommendations for Other Services    Frequency 7X/week   Progress towards PT Goals Progress towards PT goals: Progressing toward goals  Plan Current plan remains appropriate    Precautions / Restrictions Precautions Precautions: None Restrictions Weight Bearing Restrictions: Yes RLE Weight Bearing: Weight bearing as tolerated   Pertinent Vitals/Pain no apparent distress     Mobility  Bed Mobility Bed Mobility: Not assessed Supine to Sit: 5: Supervision Sit to Supine: 4: Min assist Details for Bed Mobility Assistance: Min A needed for R LE back into bed Transfers Sit to Stand: 5: Supervision;With upper extremity assist;From chair/3-in-1;From bed Stand to Sit: 5: Supervision;With upper extremity assist;To chair/3-in-1 Details for Transfer Assistance: Cues for safe technique Ambulation/Gait Ambulation/Gait Assistance: 5: Supervision Ambulation Distance (Feet): 300 Feet Assistive device: Rolling walker Ambulation/Gait Assistance Details: Able to increase ambulation this afternoon. Progressing well. Cues to relax shoulders and stand upright Gait Pattern: Step-through pattern;Decreased stride length Gait velocity: decreased    Exercises     PT Diagnosis:    PT Problem List:   PT Treatment Interventions:     PT Goals (current goals can now be found in the care plan section) Acute  Rehab PT Goals Patient Stated Goal: Pain free R hip.  Visit Information  Last PT Received On: 06/28/12 Assistance Needed: +1 History of Present Illness: S/p RTHA direct anterior approach    Subjective Data  Patient Stated Goal: Pain free R hip.   Cognition  Cognition Arousal/Alertness: Awake/alert Behavior During Therapy: WFL for tasks assessed/performed Overall Cognitive Status: Within Functional Limits for tasks assessed    Balance  Balance Balance Assessed: Yes Static Standing Balance Static Standing - Balance Support: No upper extremity supported;During functional activity Static Standing - Level of Assistance: 5: Stand by assistance Static Standing - Comment/# of Minutes: 1  End of Session PT - End of Session Equipment Utilized During Treatment: Gait belt Activity Tolerance: Patient tolerated treatment well Patient left: with call bell/phone within reach;in bed Nurse Communication: Mobility status   GP     Fredrich Birks 06/28/2012, 2:23 PM 06/28/2012 Fredrich Birks PTA [phone] pager [phone] office

## 2012-06-28 NOTE — Evaluation (Signed)
Occupational Therapy Evaluation Patient Details Name: Tara Acosta MRN: [id number] DOB: [birth year] Today's Date: 06/28/2012 Time: 4098-1191 OT Time Calculation (min): 26 min  OT Assessment / Plan / Recommendation History of present illness S/p RTHA direct anterior approach   Clinical Impression   Pt presents at a supervision level for mobility and ADL transfers and requires min assist for LB ADL.  Instructed in use of DME and AE for LB ADL.  No further OT needs.    OT Assessment  Patient does not need any further OT services    Follow Up Recommendations  No OT follow up;Supervision/Assistance - 24 hour    Barriers to Discharge      Equipment Recommendations  3 in 1 bedside comode    Recommendations for Other Services    Frequency       Precautions / Restrictions Precautions Precautions: None Restrictions Weight Bearing Restrictions: Yes RLE Weight Bearing: Weight bearing as tolerated   Pertinent Vitals/Pain 4-5/10 premedicated, repositioned    ADL  Eating/Feeding: Independent Where Assessed - Eating/Feeding: Chair Grooming: Supervision/safety Where Assessed - Grooming: Unsupported standing Upper Body Bathing: Set up Where Assessed - Upper Body Bathing: Unsupported sitting Lower Body Bathing: Minimal assistance Where Assessed - Lower Body Bathing: Unsupported sitting;Supported sit to stand Upper Body Dressing: Set up Where Assessed - Upper Body Dressing: Unsupported sitting Lower Body Dressing: Minimal assistance Where Assessed - Lower Body Dressing: Unsupported sitting;Supported sit to stand Toilet Transfer: Supervision/safety Toilet Transfer Method: Sit to Barista: Raised toilet seat with arms (or 3-in-1 over toilet) Toileting - Clothing Manipulation and Hygiene: Modified independent Where Assessed - Engineer, mining and Hygiene: Standing Tub/Shower Transfer: Insurance risk surveyor Method: Engineer, materials: Walk in shower (to 3 in 1) Equipment Used: Rolling walker;Long-handled shoe horn;Long-handled sponge;Reacher;Sock aid Transfers/Ambulation Related to ADLs: supervision with RW ADL Comments: Educated on multiple uses of 3 in1 and in AE for LB ADL. Pt will rely on her husband.    OT Diagnosis:    OT Problem List:   OT Treatment Interventions:     OT Goals(Current goals can be found in the care plan section) Acute Rehab OT Goals Patient Stated Goal: Pain free R hip.  Visit Information  Last OT Received On: 06/28/12 Assistance Needed: +1 History of Present Illness: S/p RTHA direct anterior approach       Prior Functioning     Home Living Family/patient expects to be discharged to:: Private residence Living Arrangements: Spouse/significant other Available Help at Discharge: Available 24 hours/day;Family Type of Home: House Home Access: Stairs to enter Secretary/administrator of Steps: 2 Entrance Stairs-Rails: None Home Layout: One level Home Equipment: None Lexicographer) Additional Comments: 3 in1 and RW delivered to room Prior Function Level of Independence: Independent Communication Communication: No difficulties Dominant Hand: Right         Vision/Perception Vision - History Baseline Vision: Wears glasses all the time Patient Visual Report: No change from baseline   Cognition  Cognition Arousal/Alertness: Awake/alert Behavior During Therapy: WFL for tasks assessed/performed Overall Cognitive Status: Within Functional Limits for tasks assessed    Extremity/Trunk Assessment Upper Extremity Assessment Upper Extremity Assessment: Overall WFL for tasks assessed Cervical / Trunk Assessment Cervical / Trunk Assessment: Normal     Mobility Bed Mobility Bed Mobility: Not assessed  Transfers Transfers: Sit to Stand;Stand to Sit Sit to Stand: 5: Supervision;With upper extremity assist;From chair/3-in-1 Stand to Sit: 5: Supervision;With upper  extremity assist;To chair/3-in-1 Details for Transfer Assistance: decreased  control of descent     Exercise Total Joint Exercises Quad Sets: AROM;Both;10 reps Heel Slides: AROM;Right;10 reps Hip ABduction/ADduction: AAROM;Right;10 reps Long Arc Quad: AROM;Right;10 reps   Balance Balance Balance Assessed: Yes Static Standing Balance Static Standing - Balance Support: No upper extremity supported;During functional activity Static Standing - Level of Assistance: 5: Stand by assistance Static Standing - Comment/# of Minutes: 1   End of Session OT - End of Session Activity Tolerance: Patient tolerated treatment well Patient left: in chair;with call bell/phone within reach;with family/visitor present  GO     Evern Bio 06/28/2012, 11:41 AM [phone]

## 2012-06-28 NOTE — Care Management Note (Signed)
    Page 1 of 2   06/29/2012     1:04:34 PM   CARE MANAGEMENT NOTE 06/29/2012  Patient:  Tara Acosta, Tara Acosta   Account Number:  [id number]  Date Initiated:  06/28/2012  Documentation initiated by:  [facility]  Subjective/Objective Assessment:   admitted postop rt total hip arthroplasty     Action/Plan:   PT/OT evals-recommended HHPT   Anticipated DC Date:  06/29/2012   Anticipated DC Plan:  HOME W HOME HEALTH SERVICES      DC Planning Services  CM consult      Choice offered to / List presented to:  C-1 Patient   DME arranged  3-N-1  WALKER - ROLLING      DME agency  [employer]     HH arranged  HH-2 PT      HH agency  [facility]   Status of service:  Completed, signed off Medicare Important Message given?   (If response is "NO", the following Medicare IM given date fields will be blank) Date Medicare IM given:   Date Additional Medicare IM given:    Discharge Disposition:  HOME W HOME HEALTH SERVICES  Per UR Regulation:  Reviewed for med. necessity/level of care/duration of stay  If discussed at Long Length of Stay Meetings, dates discussed:    Comments:  06/28/12 Spoke with patient about HHC on 06/27/12, she chose [facility] from the [facility] agenies list. Dava Najjar with [facility] and set up HHPT. Rolling walker and 3N1 delivered to patient's room by [employer]. Jacquelynn Cree RN, BSN, CCM

## 2012-06-29 ENCOUNTER — Encounter (HOSPITAL_COMMUNITY): Payer: Self-pay | Admitting: Orthopaedic Surgery

## 2012-06-29 LAB — CBC
MCHC: 32.6 g/dL (ref 30.0–36.0)
Platelets: 154 10*3/uL (ref 150–400)
RDW: 12.6 % (ref 11.5–15.5)
WBC: 6 10*3/uL (ref 4.0–10.5)

## 2012-06-29 MED ORDER — HYDROCODONE-ACETAMINOPHEN 5-325 MG PO TABS: 1.0000 | ORAL_TABLET | ORAL | Status: DC | PRN

## 2012-06-29 MED ORDER — ASPIRIN 325 MG PO TBEC: 325.0000 mg | DELAYED_RELEASE_TABLET | Freq: Two times a day (BID) | ORAL | Status: DC

## 2012-06-29 MED ORDER — METHOCARBAMOL 500 MG PO TABS: 500.0000 mg | ORAL_TABLET | Freq: Four times a day (QID) | ORAL | Status: DC | PRN

## 2012-06-29 NOTE — Progress Notes (Signed)
Subjective: 3 Days Post-Op Procedure(s) (LRB): RIGHT TOTAL HIP ARTHROPLASTY ANTERIOR APPROACH (Right) Patient reports pain as mild.    Objective: Vital signs in last 24 hours: Temp:  [98.4 F (36.9 C)-99.4 F (37.4 C)] 98.4 F (36.9 C) (06/27 0513) Pulse Rate:  [94-106] 94 (06/27 0513) Resp:  [14-16] 16 (06/27 0513) BP: (109-124)/(63-65) 124/65 mmHg (06/27 0513) SpO2:  [98 %-100 %] 98 % (06/27 0513)  Intake/Output from previous day: 06/26 0701 - 06/27 0700 In: -  Out: 1 [Urine:1] Intake/Output this shift:     Recent Labs  06/27/12 0458 06/28/12 0505 06/29/12 0440  HGB 10.6* 9.9* 9.8*    Recent Labs  06/28/12 0505 06/29/12 0440  WBC 5.7 6.0  RBC 3.21* 3.19*  HCT 30.1* 30.1*  PLT 152 154    Recent Labs  06/27/12 0458  NA 136  K 4.3  CL 102  CO2 28  BUN 12  CREATININE 0.67  GLUCOSE 145*  CALCIUM 8.0*   No results found for this basename: LABPT, INR,  in the last 72 hours  Sensation intact distally Intact pulses distally Dorsiflexion/Plantar flexion intact Incision: dressing C/D/I  Assessment/Plan: 3 Days Post-Op Procedure(s) (LRB): RIGHT TOTAL HIP ARTHROPLASTY ANTERIOR APPROACH (Right) Discharge home with home health  Kathryne Hitch 06/29/2012, 6:47 AM

## 2012-06-29 NOTE — Progress Notes (Signed)
Pt discharged to home accompanied by family. Discharge instructions and rx given and explained and pt stated understanding. Pts IV was removed. Pt left unit in a stable condition via wheelchair. 

## 2012-06-29 NOTE — Discharge Summary (Signed)
Patient ID: Tara Acosta MRN: [id number] DOB/AGE: 1950/04/02 62 y.o.  Admit date: 06/26/2012 Discharge date: 06/29/2012  Admission Diagnoses:  Principal Problem:   Degenerative arthritis of hip   Discharge Diagnoses:  Same  Past Medical History  Diagnosis Date  . NEPHROLITHIASIS, HX OF 02/06/2007    cystoscopy- with anesth.   . OSTEOARTHRITIS 02/06/2007  . 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 GERD (gastroesophageal reflux disease)   . Family history of anesthesia complication     family /w N&V    Surgeries: Procedure(s): RIGHT TOTAL HIP ARTHROPLASTY ANTERIOR APPROACH on 06/26/2012   Consultants:    Discharged Condition: Improved  Hospital Course: Tara Acosta is an 62 y.o. female who was admitted 06/26/2012 for operative treatment ofDegenerative arthritis of hip. Patient has severe unremitting pain that affects sleep, daily activities, and work/hobbies. After pre-op clearance the patient was taken to the operating room on 06/26/2012 and underwent  Procedure(s): RIGHT TOTAL HIP ARTHROPLASTY ANTERIOR APPROACH.    Patient was given perioperative antibiotics: Anti-infectives   Start     Dose/Rate Route Frequency Ordered Stop   06/26/12 2015  ceFAZolin (ANCEF) IVPB 1 g/50 mL premix     1 g 100 mL/hr over 30 Minutes Intravenous Every 6 hours 06/26/12 2009 06/27/12 0339   06/26/12 0600  ceFAZolin (ANCEF) IVPB 2 g/50 mL premix     2 g 100 mL/hr over 30 Minutes Intravenous On call to O.R. 06/25/12 1433 06/26/12 1519       Patient was given sequential compression devices, early ambulation, and chemoprophylaxis to prevent DVT.  Patient benefited maximally from hospital stay and there were no complications.    Recent vital signs: Patient Vitals for the past 24 hrs:  BP Temp Pulse Resp SpO2  06/29/12 0513 124/65 mmHg 98.4 F (36.9 C) 94 16 98 %  06/28/12 2126 109/63 mmHg 99.4 F (37.4 C) 106 14 98 %  06/28/12 1315 115/65 mmHg 98.4 F (36.9 C) 102 16 100 %     Recent laboratory  studies:  Recent Labs  06/27/12 0458 06/28/12 0505 06/29/12 0440  WBC 6.8 5.7 6.0  HGB 10.6* 9.9* 9.8*  HCT 32.2* 30.1* 30.1*  PLT 177 152 154  NA 136  --   --   K 4.3  --   --   CL 102  --   --   CO2 28  --   --   BUN 12  --   --   CREATININE 0.67  --   --   GLUCOSE 145*  --   --   CALCIUM 8.0*  --   --      Discharge Medications:     Medication List    STOP taking these medications       GLUCOSAMINE HCL PO     ibuprofen 200 MG tablet  Commonly known as:  ADVIL,MOTRIN      TAKE these medications       acetaminophen 500 MG tablet  Commonly known as:  TYLENOL  Take 1,000 mg by mouth every 6 (six) hours as needed for pain.     aspirin 325 MG EC tablet  Take 1 tablet (325 mg total) by mouth 2 (two) times daily after a meal.     HYDROcodone-acetaminophen 5-325 MG per tablet  Commonly known as:  NORCO  Take 1 tablet by mouth every 4 (four) hours as needed for pain.     methocarbamol 500 MG tablet  Commonly known as:  ROBAXIN  Take 1 tablet (  500 mg total) by mouth every 6 (six) hours as needed.     ZANTAC 75 PO  Take 1 tablet by mouth 2 (two) times daily as needed. Acid reflux        Diagnostic Studies: Dg Chest 2 View  06/18/2012   *RADIOLOGY REPORT*  Clinical Data: Preop for right hip arthroplasty  CHEST - 2 VIEW  Comparison: 09/28/2010  Findings: The cardiomediastinal silhouette is stable.  No acute infiltrate or pleural effusion.  No pulmonary edema.  Bony thorax is unremarkable.  IMPRESSION: No active disease.  No significant change.   Original Report Authenticated By: Natasha Mead, M.D.   Dg Hip Operative Right  06/26/2012   *RADIOLOGY REPORT*  Clinical Data: Right total hip anterior approach  DG OPERATIVE RIGHT HIP  Comparison: Three digital C-arm fluoroscopic images obtained intraoperatively without prior radiographs for comparison  Findings: Osseous demineralization. Acetabular and femoral components of a right hip prosthesis are identified in expected  positions. No fracture or dislocation seen.  IMPRESSION: Right hip prosthesis without acute complication.   Original Report Authenticated By: Ulyses Southward, M.D.   Dg Pelvis Portable  06/26/2012   *RADIOLOGY REPORT*  Clinical Data: Post right hip replacement.  PORTABLE PELVIS,PORTABLE RIGHT HIP - 1 VIEW  Comparison: Intraoperative films.  Findings: Total right hip replacement appears in satisfactory position without complication noted. Nutrient foramen below the prosthesis on lateral view.  Prominent left hip joint degenerative changes.  IMPRESSION: Total right hip replacement appears in satisfactory position without complication noted. Nutrient foramen below the prosthesis on lateral view.  This is felt unlikely to represent fracture. Attention to this on follow-up.  Prominent left hip joint degenerative changes.   Original Report Authenticated By: Lacy Duverney, M.D.   Dg Hip Portable 1 View Right  06/26/2012   *RADIOLOGY REPORT*  Clinical Data: Post right hip replacement.  PORTABLE PELVIS,PORTABLE RIGHT HIP - 1 VIEW  Comparison: Intraoperative films.  Findings: Total right hip replacement appears in satisfactory position without complication noted. Nutrient foramen below the prosthesis on lateral view.  Prominent left hip joint degenerative changes.  IMPRESSION: Total right hip replacement appears in satisfactory position without complication noted. Nutrient foramen below the prosthesis on lateral view.  This is felt unlikely to represent fracture. Attention to this on follow-up.  Prominent left hip joint degenerative changes.   Original Report Authenticated By: Lacy Duverney, M.D.    Disposition: 01-Home or Self Care      Discharge Orders   Future Orders Complete By Expires     Call MD / Call 911  As directed     Comments:      If you experience chest pain or shortness of breath, CALL 911 and be transported to the hospital emergency room.  If you develope a fever above 101 F, pus (white drainage) or  increased drainage or redness at the wound, or calf pain, call your surgeon's office.    Constipation Prevention  As directed     Comments:      Drink plenty of fluids.  Prune juice may be helpful.  You may use a stool softener, such as Colace (over the counter) 100 mg twice a day.  Use MiraLax (over the counter) for constipation as needed.    Diet - low sodium heart healthy  As directed     Discharge instructions  As directed     Comments:      Increase your activities as comfort allows. You can shower with your current dressing in place.  You can remove your dressing this coming Monday 6/30 and get your actual incision wet in the shower, then new dry dressing daily. Get an over-the-counter stool softener.    Discharge patient  As directed     Increase activity slowly as tolerated  As directed        Follow-up Information   Follow up with Kathryne Hitch, MD In 2 weeks.   Contact information:   [address] [place] [address] [place] [address] [phone]        Signed: Kathryne Hitch 06/29/2012, 6:51 AM

## 2012-06-29 NOTE — Progress Notes (Signed)
Physical Therapy Treatment Patient Details Name: Tara Acosta MRN: [id number] DOB: 08-23-1950 Today's Date: 06/29/2012 Time: 7829-5621 PT Time Calculation (min): 35 min  PT Assessment / Plan / Recommendation  PT Comments   Pt is ready for d/c home s/p right THA.  She is able to demonstrate all basic mobility tasks with safe technique and generally at supervision level in this controlled hospital environment.  Has equipment needs met and very supportive spouse.  Plan to transition to [facility] service to continue rehabilitation in home.  D/c today per MD note.   Follow Up Recommendations        Does the patient have the potential to tolerate intense rehabilitation     Barriers to Discharge        Equipment Recommendations       Recommendations for Other Services    Frequency     Progress towards PT Goals Progress towards PT goals: Goals met/education completed, patient discharged from PT  Plan Current plan remains appropriate    Precautions / Restrictions Restrictions Weight Bearing Restrictions: Yes RLE Weight Bearing: Weight bearing as tolerated   Pertinent Vitals/Pain Sore and tight in right hip/thigh.  Educated on active movement for edema control and apply ice 15-20 min bouts as needed for pain/swelling management.  Pt/spouse verbalized understanding.     Mobility  Bed Mobility Bed Mobility: Sit to Supine;Supine to Sit Supine to Sit: 5: Supervision;HOB flat Sit to Supine: 4: Min assist;HOB flat Details for Bed Mobility Assistance: continues to need min assist to safely elevate RLE to bed surface.  Educated on use of towel/sheet to simulate leg lifter to self-assist and spouse verbally acknowlegde how to assist. Transfers Transfers: Sit to Stand;Stand to Sit Sit to Stand: 6: Modified independent (Device/Increase time);From toilet;From bed;With upper extremity assist Stand to Sit: 6: Modified independent (Device/Increase time);With upper extremity assist;To bed;To elevated  surface Details for Transfer Assistance: observed for safety and pain management techniques with no concerns.  Raised bed to simulate home height and pt able to safely manage on/off transistions to/from RW Ambulation/Gait Ambulation/Gait Assistance: 5: Supervision Ambulation Distance (Feet): 500 Feet Assistive device: Rolling walker Ambulation/Gait Assistance Details: demonstration/instruction on equalizing stride length and stance time to begin transition back to normalized gait pattern using RW with goal of eventual transiton to cane or no ambulatory aide.  Instructed on UE positioning using RW to minimize shoulder/neck stress and tips for decreasing stress at hands and wrists.  Pt needs frequent cues and is not yet able to increase stance time on operated leg Gait Pattern: Step-through pattern;Decreased step length - left;Decreased stance time - right;Decreased weight shift to right;Antalgic Gait velocity: 1.64 ft/sec (goal is 3.3 ft/sec to predict independence with mobility) Stairs: Yes Stairs Assistance: 4: Min guard Stair Management Technique: With walker;Two rails;Step to pattern;Forwards Number of Stairs: 1    Exercises     PT Diagnosis:    PT Problem List:   PT Treatment Interventions:     PT Goals (current goals can now be found in the care plan section)    Visit Information  Last PT Received On: 06/29/12 Assistance Needed: +1 History of Present Illness: S/p RTHA direct anterior approach    Subjective Data  Subjective: Ready to go home   Cognition  Cognition Arousal/Alertness: Awake/alert Behavior During Therapy: WFL for tasks assessed/performed Overall Cognitive Status: Within Functional Limits for tasks assessed    Balance     End of Session PT - End of Session Equipment Utilized During Treatment: Gait  belt Activity Tolerance: Patient tolerated treatment well Patient left: in bed;with call bell/phone within reach;with family/visitor present Nurse Communication:  Mobility status   GP     Dennis Bast 06/29/2012, 9:02 AM

## 2012-08-06 ENCOUNTER — Other Ambulatory Visit: Payer: Self-pay | Admitting: Dermatology

## 2013-07-30 LAB — HM MAMMOGRAPHY

## 2013-11-08 ENCOUNTER — Ambulatory Visit (INDEPENDENT_AMBULATORY_CARE_PROVIDER_SITE_OTHER): Payer: Medicare Other

## 2013-11-08 DIAGNOSIS — Z23 Encounter for immunization: Secondary | ICD-10-CM

## 2014-01-17 ENCOUNTER — Other Ambulatory Visit (INDEPENDENT_AMBULATORY_CARE_PROVIDER_SITE_OTHER): Payer: Medicare Other | Admitting: *Deleted

## 2014-01-17 ENCOUNTER — Other Ambulatory Visit (INDEPENDENT_AMBULATORY_CARE_PROVIDER_SITE_OTHER): Payer: Medicare Other

## 2014-01-17 DIAGNOSIS — Z0000 Encounter for general adult medical examination without abnormal findings: Secondary | ICD-10-CM

## 2014-01-17 LAB — COMPREHENSIVE METABOLIC PANEL
ALBUMIN: 4.1 g/dL (ref 3.5–5.2)
ALK PHOS: 67 U/L (ref 39–117)
ALT: 31 U/L (ref 0–35)
AST: 24 U/L (ref 0–37)
BUN: 12 mg/dL (ref 6–23)
CHLORIDE: 105 meq/L (ref 96–112)
CO2: 29 meq/L (ref 19–32)
CREATININE: 0.76 mg/dL (ref 0.40–1.20)
Calcium: 9.3 mg/dL (ref 8.4–10.5)
GFR: 81 mL/min (ref >60.00)
Glucose, Bld: 88 mg/dL (ref 70–99)
POTASSIUM: 4 meq/L (ref 3.5–5.1)
SODIUM: 141 meq/L (ref 135–145)
Total Bilirubin: 0.5 mg/dL (ref 0.2–1.2)
Total Protein: 7 g/dL (ref 6.0–8.3)

## 2014-01-17 LAB — POCT URINALYSIS DIPSTICK
Bilirubin, UA: NEGATIVE
Glucose, UA: NEGATIVE
Nitrite, UA: NEGATIVE
PROTEIN UA: NEGATIVE
Spec Grav, UA: 1.015
Urobilinogen, UA: 0.2
pH, UA: 7

## 2014-01-17 LAB — CBC WITH DIFFERENTIAL/PLATELET
BASOS ABS: 0 10*3/uL (ref 0.0–0.1)
Basophils Relative: 0.6 % (ref 0.0–3.0)
EOS ABS: 0.1 10*3/uL (ref 0.0–0.7)
Eosinophils Relative: 3.2 % (ref 0.0–5.0)
HEMATOCRIT: 39.4 % (ref 36.0–46.0)
Hemoglobin: 13.2 g/dL (ref 12.0–15.0)
Lymphocytes Relative: 38.5 % (ref 12.0–46.0)
Lymphs Abs: 1.7 10*3/uL (ref 0.7–4.0)
MCHC: 33.5 g/dL (ref 30.0–36.0)
MCV: 93.3 fl (ref 78.0–100.0)
MONO ABS: 0.4 10*3/uL (ref 0.1–1.0)
MONOS PCT: 8.2 % (ref 3.0–12.0)
NEUTROS ABS: 2.1 10*3/uL (ref 1.4–7.7)
Neutrophils Relative %: 49.5 % (ref 43.0–77.0)
PLATELETS: 245 10*3/uL (ref 150.0–400.0)
RBC: 4.23 Mil/uL (ref 3.87–5.11)
RDW: 13.6 % (ref 11.5–15.5)
WBC: 4.3 10*3/uL (ref 4.0–10.5)

## 2014-01-17 LAB — LIPID PANEL
Cholesterol: 235 mg/dL — ABNORMAL HIGH (ref 0–200)
HDL: 62 mg/dL (ref >39.00)
LDL Cholesterol: 157 mg/dL — ABNORMAL HIGH (ref 0–99)
NonHDL: 173
Total CHOL/HDL Ratio: 4
Triglycerides: 78 mg/dL (ref 0.0–149.0)
VLDL: 15.6 mg/dL (ref 0.0–40.0)

## 2014-01-17 LAB — TSH: TSH: 2.28 u[IU]/mL (ref 0.35–4.50)

## 2014-01-24 ENCOUNTER — Encounter: Payer: 59 | Admitting: Internal Medicine

## 2014-01-31 ENCOUNTER — Encounter: Payer: Self-pay | Admitting: Internal Medicine

## 2014-01-31 ENCOUNTER — Ambulatory Visit (INDEPENDENT_AMBULATORY_CARE_PROVIDER_SITE_OTHER): Payer: Medicare Other | Admitting: Internal Medicine

## 2014-01-31 VITALS — BP 130/72 | HR 87 | Temp 98.2°F | Resp 20 | Ht 67.5 in | Wt 162.0 lb

## 2014-01-31 DIAGNOSIS — Z0000 Encounter for general adult medical examination without abnormal findings: Secondary | ICD-10-CM

## 2014-01-31 DIAGNOSIS — Z87442 Personal history of urinary calculi: Secondary | ICD-10-CM

## 2014-01-31 DIAGNOSIS — Z23 Encounter for immunization: Secondary | ICD-10-CM

## 2014-01-31 DIAGNOSIS — K219 Gastro-esophageal reflux disease without esophagitis: Secondary | ICD-10-CM

## 2014-01-31 DIAGNOSIS — M160 Bilateral primary osteoarthritis of hip: Secondary | ICD-10-CM

## 2014-01-31 DIAGNOSIS — M1611 Unilateral primary osteoarthritis, right hip: Secondary | ICD-10-CM

## 2014-01-31 NOTE — Progress Notes (Signed)
Subjective:    Patient ID: Tara Acosta, female    DOB: [birth year], 64 y.o.   MRN: [id number]  HPI   Subjective:    Patient ID: Tara Acosta, female    DOB: [birth year], 64 y.o.   MRN: [id number]  HPI  64 -year-old patient who is seen today for a wellness exam.  She enjoys excellent health has had some GERD symptoms which grossly are controlled with diet and when necessary OTC H2 blockers.  She is status post right hip surgery for advanced arthritis.  She does have a history of nephrolithiasis. She presently takes no chronic medications.  Past medical history is unremarkable except for a resection for a melanoma involving the right lower arm She's had a tubal ligation in the past otherwise no surgeries no pregnancies she's had one hospitalization that she recalls her symptomatic renal colic  Family history father died age 4 history of hypertension and gout Mother age 75 he has osteoarthritis and hypertension 2 brothers one died of a gunshot wound  Past Medical History  Diagnosis Date  . NEPHROLITHIASIS, HX OF 02/06/2007    cystoscopy- with anesth.   . OSTEOARTHRITIS 02/06/2007  . 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 GERD (gastroesophageal reflux disease)   . Family history of anesthesia complication     family /w N&V    History   Social History  . Marital Status: Married    Spouse Name: N/A    Number of Children: N/A  . Years of Education: N/A   Occupational History  . retired [employer]   Social History Main Topics  . Smoking status: Never Smoker   . Smokeless tobacco: Never Used  . Alcohol Use: No     Comment: social  . Drug Use: No  . Sexual Activity: Not on file   Other Topics Concern  . Not on file   Social History Narrative   Married   No children    Past Surgical History  Procedure Laterality Date  . Tubal ligation    . Colonoscopy  2009  . Total hip arthroplasty Right 06/26/2012    Procedure: RIGHT TOTAL HIP ARTHROPLASTY ANTERIOR APPROACH;   Surgeon: Kathryne Hitch, MD;  Location: [facility] OR;  Service: Orthopedics;  Laterality: Right;    Family History  Problem Relation Age of Onset  . Gout Father   . Hypertension Father   . Dementia Father   . Hypertension Mother   . Coronary artery disease Mother   . Pancreatic cancer      Uncle    No Known Allergies  Current Outpatient Prescriptions on File Prior to Visit  Medication Sig Dispense Refill  . Ranitidine HCl (ZANTAC 75 PO) Take 1 tablet by mouth 2 (two) times daily as needed. Acid reflux     No current facility-administered medications on file prior to visit.    BP 130/72 mmHg  Pulse 87  Temp(Src) 98.2 F (36.8 C) (Oral)  Resp 20  Ht 5' 7.5" (1.715 m)  Wt 162 lb (73.483 kg)  BMI 24.98 kg/m2  SpO2 98%   1. Risk factors, based on past  M,S,F history.  No cardiovascular risk factors.  Both parents enjoyed longevity and mother is still alive  2.  Physical activities: Has joined [employer] at [employer] and is fairly active  3.  Depression/mood: No history depression or mood disorder  4.  Hearing: No deficits  5.  ADL's: Independent in all aspects of daily living  6.  Fall  risk: Low  7.  Home safety: No problems identified  8.  Height weight, and visual acuity; height and weight stable no change in visual acuity   9.  Counseling: Continue heart healthy diet regular exercise  10. Lab orders based on risk factors: Laboratory profile reviewed  11. Referral : Not appropriate at this time.  Has had recent eye examination   12. Care plan: Continue exercise regimen and heart healthy diet  13. Cognitive assessment: Alert in order with normal affect.  No cognitive dysfunction  14. Screening: Patient had a screening mammogram 6 months ago.  Will be due for follow-up colonoscopy in 4 years.  Patient was provided with a written and personalized care plan  15. Provider List Update: Includes ophthalmology primary care and gastroenterology as well as  orthopedics      Review of Systems  Constitutional: Negative for fever, appetite change, fatigue and unexpected weight change.  HENT: Negative for hearing loss, ear pain, nosebleeds, congestion, sore throat, mouth sores, trouble swallowing, neck stiffness, dental problem, voice change, sinus pressure and tinnitus.   Eyes: Negative for photophobia, pain, redness and visual disturbance.  Respiratory: Negative for cough, chest tightness and shortness of breath.   Cardiovascular: Negative for chest pain, palpitations and leg swelling.  Gastrointestinal: Negative for nausea, vomiting, abdominal pain, diarrhea, constipation, blood in stool, abdominal distention and rectal pain.  Genitourinary: Negative for dysuria, urgency, frequency, hematuria, flank pain, vaginal bleeding, vaginal discharge, difficulty urinating, genital sores, vaginal pain, menstrual problem and pelvic pain.  Musculoskeletal: Negative for back pain and arthralgias.    Skin: Negative for rash.  Neurological: Negative for dizziness, syncope, speech difficulty, weakness, light-headedness, numbness and headaches.  Hematological: Negative for adenopathy. Does not bruise/bleed easily.  Psychiatric/Behavioral: Negative for suicidal ideas, behavioral problems, self-injury, dysphoric mood and agitation. The patient is not nervous/anxious.        Objective:   Physical Exam  Constitutional: She is oriented to person, place, and time. She appears well-developed and well-nourished.  HENT:  Head: Normocephalic and atraumatic.  Right Ear: External ear normal.  Left Ear: External ear normal.  Mouth/Throat: Oropharynx is clear and moist.  Eyes: Conjunctivae normal and EOM are normal.  Neck: Normal range of motion. Neck supple. No JVD present. No thyromegaly present.  Cardiovascular: Normal rate, regular rhythm, normal heart sounds and intact distal pulses.   No murmur heard. Pulmonary/Chest: Effort normal and breath sounds normal. She  has no wheezes. She has no rales.  Abdominal: Soft. Bowel sounds are normal. She exhibits no distension and no mass. There is no tenderness. There is no rebound and no guarding.  Musculoskeletal: Normal range of motion. She exhibits no edema and no tenderness.  Neurological: She is alert and oriented to person, place, and time. She has normal reflexes. No cranial nerve deficit. She exhibits normal muscle tone. Coordination normal.  Skin: Skin is warm and dry. No rash noted.  Psychiatric: She has a normal mood and affect. Her behavior is normal.          Assessment & Plan:   Preventive health exam Gastroesophageal reflux disease. Antireflux regimen discussed Status post right total knee replacement surgery Mild dyslipidemia. Improved  Vitamin D exercise and calcium supplementation all encouraged Annual dermatologic evaluation Recheck here one year or when necessary  Review of Systems    as above Objective:   Physical Exam  Cardiovascular:  Slightly diminished right dorsalis pedis pulse    As above      Assessment & Plan:  Preventive health examination Osteoarthritis, stable GERD.  Well-controlled on present regimen  Recheck one year Laboratory studies reviewed

## 2014-01-31 NOTE — Progress Notes (Signed)
Pre visit review using our clinic review tool, if applicable. No additional management support is needed unless otherwise documented below in the visit note. 

## 2014-01-31 NOTE — Patient Instructions (Signed)

## 2014-08-07 IMAGING — CR DG PORTABLE PELVIS
1 series · 1 of 1 positions shown · non-contrast
Comparison: Intraoperative films.

CLINICAL DATA: Post right hip replacement.

PORTABLE PELVIS,PORTABLE RIGHT HIP - 1 VIEW

[AP]
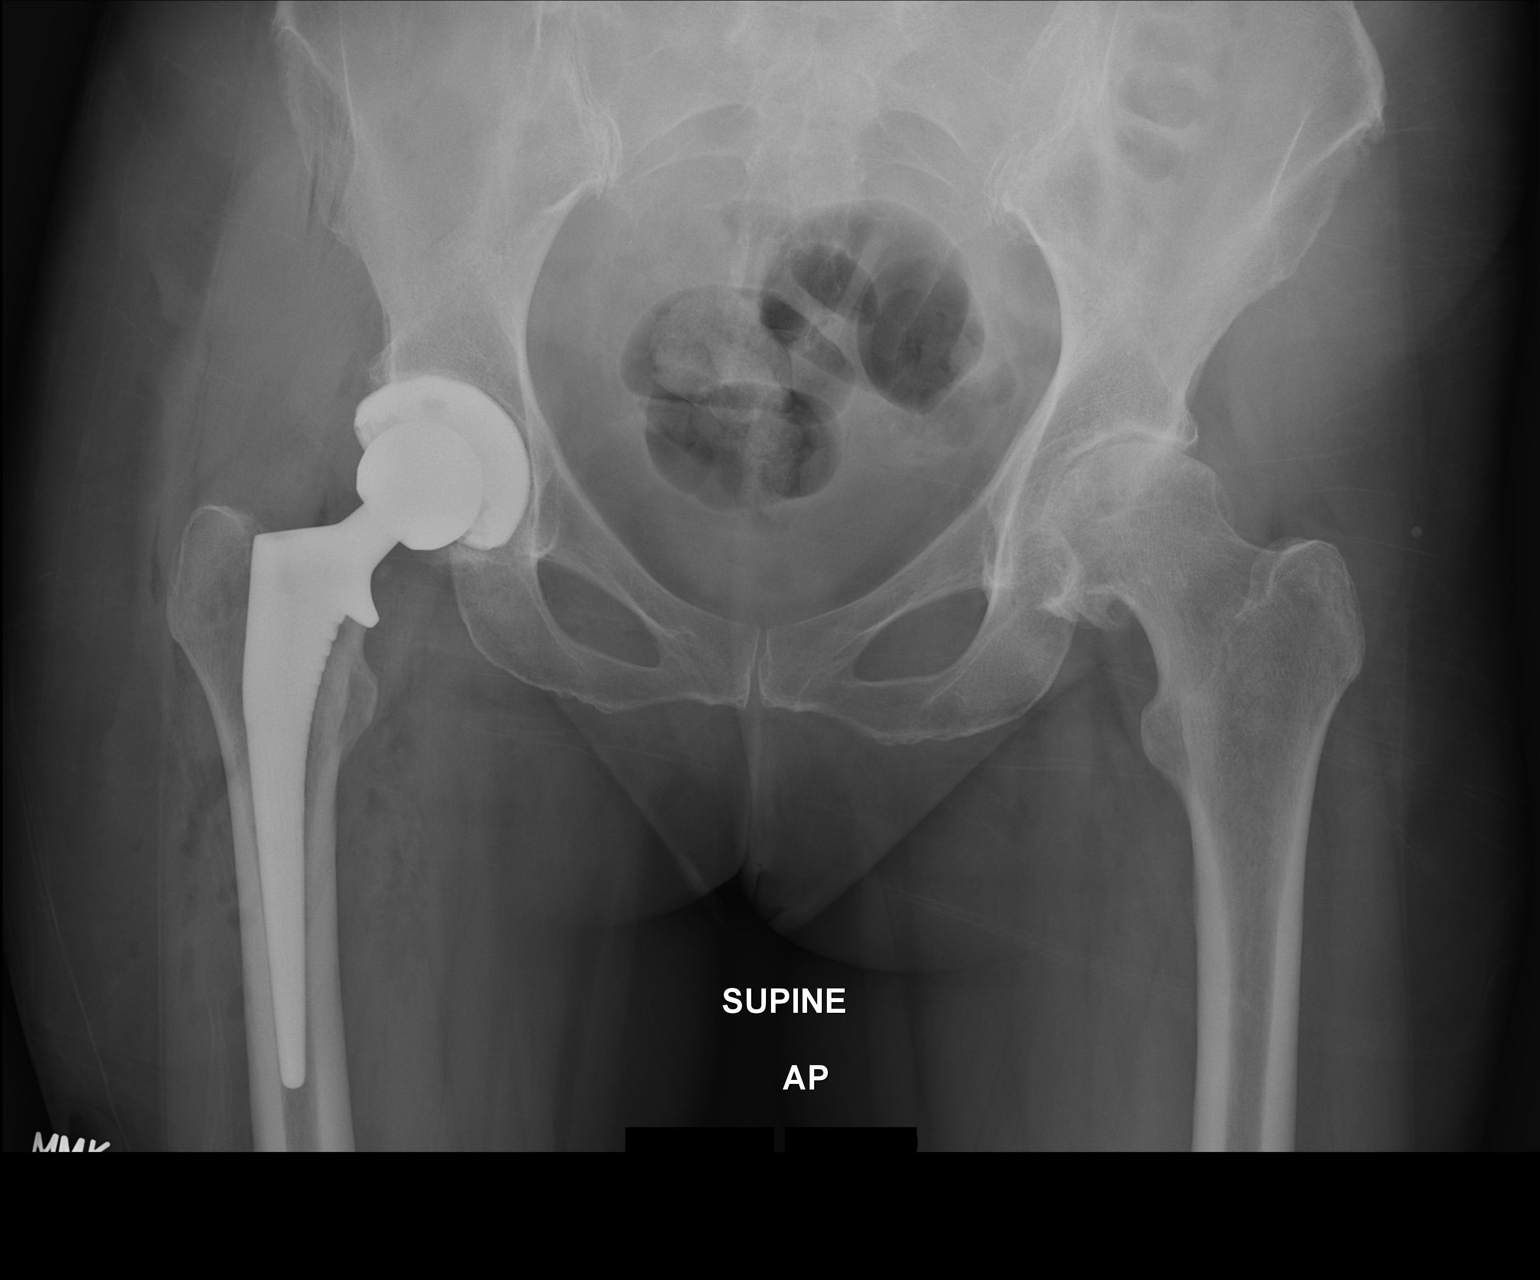

[1 of 1 positions shown; findings below may reference images not displayed]

FINDINGS: Total right hip replacement appears in satisfactory
position without complication noted. Nutrient foramen below the
prosthesis on lateral view.

Prominent left hip joint degenerative changes.
IMPRESSION: Total right hip replacement appears in satisfactory position
without complication noted. Nutrient foramen below the prosthesis
on lateral view.  This is felt unlikely to represent fracture.
Attention to this on follow-up..

Prominent left hip joint degenerative changes.

## 2014-10-21 ENCOUNTER — Encounter: Payer: Self-pay | Admitting: Gastroenterology

## 2015-04-16 LAB — HM MAMMOGRAPHY

## 2015-04-23 ENCOUNTER — Encounter: Payer: Self-pay | Admitting: Internal Medicine

## 2015-07-22 ENCOUNTER — Other Ambulatory Visit: Payer: Self-pay | Admitting: Orthopaedic Surgery

## 2015-07-23 NOTE — Pre-Procedure Instructions (Signed)
    Augusto GarbeCarol B Sickles  07/23/2015      [id number] [place], [place] [address] [facility] [address] [facility] [address]  [place] [id number] Phone: [phone] Fax: [phone]    Your procedure is scheduled on 08/04/15.  [facility]  Call this number if you have problems the morning of surgery:  [phone]   Remember:  Do not eat food or drink liquids after midnight.  Take these medicines the morning of surgery with A SIP OF WATER --ZANTAC   Do not wear jewelry, make-up or nail polish.  Do not wear lotions, powders, or perfumes.  You may wear deoderant.  Do not shave 48 hours prior to surgery.  Men may shave face and neck.  Do not bring valuables to the hospital.  [facility] is not responsible for any belongings or valuables.  Contacts, dentures or bridgework may not be worn into surgery.  Leave your suitcase in the car.  After surgery it may be brought to your room.  For patients admitted to the hospital, discharge time will be determined by your treatment team.  Patients discharged the day of surgery will not be allowed to drive home.   Name and phone number of your driver:   Special instructions:  Do not take any aspirin,anti-inflammatories,vitamins,or herbal supplements 5-7 days prior to surgery.  Please read over the following fact sheets that you were given. MRSA Information

## 2015-07-24 ENCOUNTER — Encounter (HOSPITAL_COMMUNITY): Payer: Self-pay

## 2015-07-24 ENCOUNTER — Encounter (HOSPITAL_COMMUNITY)
Admission: RE | Admit: 2015-07-24 | Discharge: 2015-07-24 | Disposition: A | Discharge status: Home / Self Care | Payer: Medicare Other | Source: Ambulatory Visit | Attending: Orthopaedic Surgery | Admitting: Orthopaedic Surgery

## 2015-07-24 DIAGNOSIS — M1612 Unilateral primary osteoarthritis, left hip: Secondary | ICD-10-CM | POA: Diagnosis not present

## 2015-07-24 DIAGNOSIS — Z01818 Encounter for other preprocedural examination: Secondary | ICD-10-CM | POA: Insufficient documentation

## 2015-07-24 DIAGNOSIS — Z01812 Encounter for preprocedural laboratory examination: Secondary | ICD-10-CM | POA: Diagnosis not present

## 2015-07-24 DIAGNOSIS — R079 Chest pain, unspecified: Secondary | ICD-10-CM | POA: Diagnosis not present

## 2015-07-24 LAB — BASIC METABOLIC PANEL
Anion gap: 7 (ref 5–15)
BUN: 11 mg/dL (ref 6–20)
CHLORIDE: 104 mmol/L (ref 101–111)
CO2: 29 mmol/L (ref 22–32)
CREATININE: 0.85 mg/dL (ref 0.44–1.00)
Calcium: 9.7 mg/dL (ref 8.9–10.3)
GFR calc Af Amer: >60 mL/min (ref >60)
GFR calc non Af Amer: >60 mL/min (ref >60)
GLUCOSE: 114 mg/dL — AB (ref 65–99)
POTASSIUM: 3.7 mmol/L (ref 3.5–5.1)
SODIUM: 140 mmol/L (ref 135–145)

## 2015-07-24 LAB — CBC
HEMATOCRIT: 40.5 % (ref 36.0–46.0)
Hemoglobin: 13.5 g/dL (ref 12.0–15.0)
MCH: 31 pg (ref 26.0–34.0)
MCHC: 33.3 g/dL (ref 30.0–36.0)
MCV: 92.9 fL (ref 78.0–100.0)
PLATELETS: 222 10*3/uL (ref 150–400)
RBC: 4.36 MIL/uL (ref 3.87–5.11)
RDW: 12.3 % (ref 11.5–15.5)
WBC: 6.1 10*3/uL (ref 4.0–10.5)

## 2015-07-24 LAB — SURGICAL PCR SCREEN
MRSA, PCR: NEGATIVE
Staphylococcus aureus: NEGATIVE

## 2015-08-03 MED ORDER — TRANEXAMIC ACID 1000 MG/10ML IV SOLN
1000.0000 mg | INTRAVENOUS | Status: AC
  Administered 2015-08-04: 1000 mg via INTRAVENOUS
  Filled 2015-08-03: qty 10

## 2015-08-03 MED ORDER — TRANEXAMIC ACID 1000 MG/10ML IV SOLN
1000.0000 mg | INTRAVENOUS | Status: DC
  Filled 2015-08-03: qty 10

## 2015-08-04 ENCOUNTER — Inpatient Hospital Stay (HOSPITAL_COMMUNITY): Payer: Medicare Other

## 2015-08-04 ENCOUNTER — Inpatient Hospital Stay (HOSPITAL_COMMUNITY): Payer: Medicare Other | Admitting: Anesthesiology

## 2015-08-04 ENCOUNTER — Inpatient Hospital Stay (HOSPITAL_COMMUNITY)
Admission: RE | Admit: 2015-08-04 | Discharge: 2015-08-06 | DRG: 470 | Disposition: A | Discharge status: Home Health | Payer: Medicare Other | Source: Ambulatory Visit | Attending: Orthopaedic Surgery | Admitting: Orthopaedic Surgery

## 2015-08-04 ENCOUNTER — Encounter (HOSPITAL_COMMUNITY)
Admission: RE | Disposition: A | Discharge status: Home Health | Payer: Self-pay | Source: Ambulatory Visit | Attending: Orthopaedic Surgery

## 2015-08-04 ENCOUNTER — Encounter (HOSPITAL_COMMUNITY): Payer: Self-pay

## 2015-08-04 DIAGNOSIS — Z96641 Presence of right artificial hip joint: Secondary | ICD-10-CM | POA: Diagnosis present

## 2015-08-04 DIAGNOSIS — Z885 Allergy status to narcotic agent status: Secondary | ICD-10-CM

## 2015-08-04 DIAGNOSIS — M1612 Unilateral primary osteoarthritis, left hip: Secondary | ICD-10-CM | POA: Diagnosis present

## 2015-08-04 DIAGNOSIS — Z800 Family history of malignant neoplasm of digestive organs: Secondary | ICD-10-CM

## 2015-08-04 DIAGNOSIS — Z96642 Presence of left artificial hip joint: Secondary | ICD-10-CM

## 2015-08-04 DIAGNOSIS — K219 Gastro-esophageal reflux disease without esophagitis: Secondary | ICD-10-CM | POA: Diagnosis present

## 2015-08-04 DIAGNOSIS — Z419 Encounter for procedure for purposes other than remedying health state, unspecified: Secondary | ICD-10-CM

## 2015-08-04 DIAGNOSIS — Z85820 Personal history of malignant melanoma of skin: Secondary | ICD-10-CM | POA: Diagnosis not present

## 2015-08-04 HISTORY — PX: TOTAL HIP ARTHROPLASTY: SHX124

## 2015-08-04 SURGERY — ARTHROPLASTY, HIP, TOTAL, ANTERIOR APPROACH
Anesthesia: Monitor Anesthesia Care | Site: Hip | Laterality: Left

## 2015-08-04 MED ORDER — PHENYLEPHRINE 40 MCG/ML (10ML) SYRINGE FOR IV PUSH (FOR BLOOD PRESSURE SUPPORT)
PREFILLED_SYRINGE | INTRAVENOUS | Status: AC
  Filled 2015-08-04: qty 10

## 2015-08-04 MED ORDER — ACETAMINOPHEN 325 MG PO TABS: 650.0000 mg | ORAL_TABLET | Freq: Four times a day (QID) | ORAL | Status: DC | PRN

## 2015-08-04 MED ORDER — SODIUM CHLORIDE 0.9 % IR SOLN
Status: DC | PRN
  Administered 2015-08-04: 3000 mL

## 2015-08-04 MED ORDER — PROPOFOL 10 MG/ML IV BOLUS
INTRAVENOUS | Status: DC | PRN
  Administered 2015-08-04: 40 mg via INTRAVENOUS

## 2015-08-04 MED ORDER — MENTHOL 3 MG MT LOZG: 1.0000 | LOZENGE | OROMUCOSAL | Status: DC | PRN

## 2015-08-04 MED ORDER — METHOCARBAMOL 500 MG PO TABS
500.0000 mg | ORAL_TABLET | Freq: Four times a day (QID) | ORAL | Status: DC | PRN
  Administered 2015-08-04 – 2015-08-06 (×5): 500 mg via ORAL
  Filled 2015-08-04 (×5): qty 1

## 2015-08-04 MED ORDER — DOCUSATE SODIUM 100 MG PO CAPS
100.0000 mg | ORAL_CAPSULE | Freq: Two times a day (BID) | ORAL | Status: DC
  Administered 2015-08-05 – 2015-08-06 (×3): 100 mg via ORAL
  Filled 2015-08-04 (×4): qty 1

## 2015-08-04 MED ORDER — OXYCODONE HCL 5 MG PO TABS
ORAL_TABLET | ORAL | Status: AC
  Filled 2015-08-04: qty 1

## 2015-08-04 MED ORDER — FENTANYL CITRATE (PF) 100 MCG/2ML IJ SOLN
INTRAMUSCULAR | Status: DC | PRN
  Administered 2015-08-04 (×5): 50 ug via INTRAVENOUS

## 2015-08-04 MED ORDER — OXYCODONE HCL 5 MG PO TABS
5.0000 mg | ORAL_TABLET | ORAL | Status: DC | PRN
  Administered 2015-08-04: 5 mg via ORAL
  Administered 2015-08-05 (×2): 10 mg via ORAL
  Filled 2015-08-04 (×3): qty 2

## 2015-08-04 MED ORDER — METOCLOPRAMIDE HCL 5 MG/ML IJ SOLN
5.0000 mg | Freq: Three times a day (TID) | INTRAMUSCULAR | Status: DC | PRN
  Administered 2015-08-04: 10 mg via INTRAVENOUS
  Filled 2015-08-04: qty 2

## 2015-08-04 MED ORDER — SODIUM CHLORIDE 0.9 % IV SOLN
INTRAVENOUS | Status: DC
  Administered 2015-08-04: 18:00:00 via INTRAVENOUS

## 2015-08-04 MED ORDER — LIDOCAINE 2% (20 MG/ML) 5 ML SYRINGE
INTRAMUSCULAR | Status: AC
  Filled 2015-08-04: qty 5

## 2015-08-04 MED ORDER — ZOLPIDEM TARTRATE 5 MG PO TABS: 5.0000 mg | ORAL_TABLET | Freq: Every evening | ORAL | Status: DC | PRN

## 2015-08-04 MED ORDER — FAMOTIDINE 20 MG PO TABS
10.0000 mg | ORAL_TABLET | Freq: Two times a day (BID) | ORAL | Status: DC
  Administered 2015-08-04 – 2015-08-06 (×4): 10 mg via ORAL
  Filled 2015-08-04 (×4): qty 1

## 2015-08-04 MED ORDER — PROPOFOL 10 MG/ML IV BOLUS
INTRAVENOUS | Status: AC
  Filled 2015-08-04: qty 20

## 2015-08-04 MED ORDER — METOCLOPRAMIDE HCL 5 MG PO TABS: 5.0000 mg | ORAL_TABLET | Freq: Three times a day (TID) | ORAL | Status: DC | PRN

## 2015-08-04 MED ORDER — PHENOL 1.4 % MT LIQD
1.0000 | OROMUCOSAL | Status: DC | PRN
Start: 2015-08-04 — End: 2015-08-06

## 2015-08-04 MED ORDER — HYDROMORPHONE HCL 1 MG/ML IJ SOLN: 1.0000 mg | INTRAMUSCULAR | Status: DC | PRN

## 2015-08-04 MED ORDER — CEFAZOLIN IN D5W 1 GM/50ML IV SOLN
1.0000 g | Freq: Four times a day (QID) | INTRAVENOUS | Status: AC
  Administered 2015-08-04 – 2015-08-05 (×2): 1 g via INTRAVENOUS
  Filled 2015-08-04 (×2): qty 50

## 2015-08-04 MED ORDER — METHOCARBAMOL 1000 MG/10ML IJ SOLN
500.0000 mg | Freq: Four times a day (QID) | INTRAVENOUS | Status: DC | PRN
  Filled 2015-08-04: qty 5

## 2015-08-04 MED ORDER — HYDROMORPHONE HCL 1 MG/ML IJ SOLN
0.2500 mg | INTRAMUSCULAR | Status: DC | PRN
  Administered 2015-08-04 (×4): 0.5 mg via INTRAVENOUS

## 2015-08-04 MED ORDER — MIDAZOLAM HCL 2 MG/2ML IJ SOLN
INTRAMUSCULAR | Status: AC
  Filled 2015-08-04: qty 2

## 2015-08-04 MED ORDER — PHENYLEPHRINE HCL 10 MG/ML IJ SOLN
INTRAMUSCULAR | Status: DC | PRN
  Administered 2015-08-04: 160 ug via INTRAVENOUS
  Administered 2015-08-04 (×2): 120 ug via INTRAVENOUS
  Administered 2015-08-04 (×2): 80 ug via INTRAVENOUS
  Administered 2015-08-04: 120 ug via INTRAVENOUS

## 2015-08-04 MED ORDER — ONE-DAILY MULTI VITAMINS PO TABS: 1.0000 | ORAL_TABLET | Freq: Every day | ORAL | Status: DC

## 2015-08-04 MED ORDER — HYDROMORPHONE HCL 1 MG/ML IJ SOLN
INTRAMUSCULAR | Status: AC
  Filled 2015-08-04: qty 1

## 2015-08-04 MED ORDER — ALUM & MAG HYDROXIDE-SIMETH 200-200-20 MG/5ML PO SUSP: 30.0000 mL | ORAL | Status: DC | PRN

## 2015-08-04 MED ORDER — METHOCARBAMOL 500 MG PO TABS
ORAL_TABLET | ORAL | Status: AC
  Filled 2015-08-04: qty 1

## 2015-08-04 MED ORDER — ONDANSETRON HCL 4 MG/2ML IJ SOLN
INTRAMUSCULAR | Status: AC
  Filled 2015-08-04: qty 2

## 2015-08-04 MED ORDER — CEFAZOLIN SODIUM-DEXTROSE 2-4 GM/100ML-% IV SOLN
2.0000 g | INTRAVENOUS | Status: AC
  Administered 2015-08-04: 2 g via INTRAVENOUS
  Filled 2015-08-04: qty 100

## 2015-08-04 MED ORDER — TRAMADOL HCL 50 MG PO TABS: 100.0000 mg | ORAL_TABLET | Freq: Four times a day (QID) | ORAL | Status: DC | PRN

## 2015-08-04 MED ORDER — EPHEDRINE SULFATE 50 MG/ML IJ SOLN
INTRAMUSCULAR | Status: DC | PRN
  Administered 2015-08-04: 10 mg via INTRAVENOUS
  Administered 2015-08-04: 5 mg via INTRAVENOUS
  Administered 2015-08-04: 10 mg via INTRAVENOUS
  Administered 2015-08-04: 5 mg via INTRAVENOUS

## 2015-08-04 MED ORDER — ONDANSETRON HCL 4 MG/2ML IJ SOLN
4.0000 mg | Freq: Four times a day (QID) | INTRAMUSCULAR | Status: DC | PRN
  Administered 2015-08-04 – 2015-08-05 (×2): 4 mg via INTRAVENOUS
  Filled 2015-08-04 (×2): qty 2

## 2015-08-04 MED ORDER — ASPIRIN EC 325 MG PO TBEC
325.0000 mg | DELAYED_RELEASE_TABLET | Freq: Two times a day (BID) | ORAL | Status: DC
  Administered 2015-08-05 – 2015-08-06 (×3): 325 mg via ORAL
  Filled 2015-08-04 (×3): qty 1

## 2015-08-04 MED ORDER — VITAMIN C 500 MG PO TABS
500.0000 mg | ORAL_TABLET | Freq: Every day | ORAL | Status: DC
  Filled 2015-08-04 (×2): qty 1

## 2015-08-04 MED ORDER — PROPOFOL 500 MG/50ML IV EMUL
INTRAVENOUS | Status: DC | PRN
  Administered 2015-08-04: 75 ug/kg/min via INTRAVENOUS

## 2015-08-04 MED ORDER — ROCURONIUM BROMIDE 50 MG/5ML IV SOLN
INTRAVENOUS | Status: AC
  Filled 2015-08-04: qty 1

## 2015-08-04 MED ORDER — EPHEDRINE 5 MG/ML INJ
INTRAVENOUS | Status: AC
  Filled 2015-08-04: qty 10

## 2015-08-04 MED ORDER — FENTANYL CITRATE (PF) 250 MCG/5ML IJ SOLN
INTRAMUSCULAR | Status: AC
  Filled 2015-08-04: qty 5

## 2015-08-04 MED ORDER — ONDANSETRON HCL 4 MG PO TABS
4.0000 mg | ORAL_TABLET | Freq: Four times a day (QID) | ORAL | Status: DC | PRN
  Administered 2015-08-05: 4 mg via ORAL
  Filled 2015-08-04 (×2): qty 1

## 2015-08-04 MED ORDER — ACETAMINOPHEN 650 MG RE SUPP: 650.0000 mg | Freq: Four times a day (QID) | RECTAL | Status: DC | PRN

## 2015-08-04 MED ORDER — LACTATED RINGERS IV SOLN
INTRAVENOUS | Status: DC
  Administered 2015-08-04 (×3): via INTRAVENOUS

## 2015-08-04 MED ORDER — 0.9 % SODIUM CHLORIDE (POUR BTL) OPTIME
TOPICAL | Status: DC | PRN
  Administered 2015-08-04: 1000 mL

## 2015-08-04 MED ORDER — MIDAZOLAM HCL 5 MG/5ML IJ SOLN
INTRAMUSCULAR | Status: DC | PRN
  Administered 2015-08-04: 2 mg via INTRAVENOUS

## 2015-08-04 MED ORDER — ADULT MULTIVITAMIN W/MINERALS CH
1.0000 | ORAL_TABLET | Freq: Every day | ORAL | Status: DC
  Filled 2015-08-04 (×2): qty 1

## 2015-08-04 MED ORDER — DIPHENHYDRAMINE HCL 12.5 MG/5ML PO ELIX: 12.5000 mg | ORAL_SOLUTION | ORAL | Status: DC | PRN

## 2015-08-04 MED ORDER — KETOROLAC TROMETHAMINE 15 MG/ML IJ SOLN
7.5000 mg | Freq: Four times a day (QID) | INTRAMUSCULAR | Status: AC
  Filled 2015-08-04: qty 1

## 2015-08-04 MED ORDER — BUPIVACAINE IN DEXTROSE 0.75-8.25 % IT SOLN
INTRATHECAL | Status: DC | PRN
  Administered 2015-08-04: 13.5 mg via INTRATHECAL

## 2015-08-04 SURGICAL SUPPLY — 54 items
APL SKNCLS STERI-STRIP NONHPOA (GAUZE/BANDAGES/DRESSINGS)
BENZOIN TINCTURE PRP APPL 2/3 (GAUZE/BANDAGES/DRESSINGS) IMPLANT
BLADE SAW SGTL 18X1.27X75 (BLADE) ×2 IMPLANT
BLADE SAW SGTL 18X1.27X75MM (BLADE) ×1
BLADE SURG ROTATE 9660 (MISCELLANEOUS) IMPLANT
CAPT HIP TOTAL 2 ×2 IMPLANT
CELLS DAT CNTRL 66122 CELL SVR (MISCELLANEOUS) ×1 IMPLANT
CLOSURE WOUND 1/2 X4 (GAUZE/BANDAGES/DRESSINGS) ×1
COVER SURGICAL LIGHT HANDLE (MISCELLANEOUS) ×3 IMPLANT
DRAPE C-ARM 42X72 X-RAY (DRAPES) ×3 IMPLANT
DRAPE STERI IOBAN 125X83 (DRAPES) ×3 IMPLANT
DRAPE U-SHAPE 47X51 STRL (DRAPES) ×9 IMPLANT
DRSG AQUACEL AG ADV 3.5X10 (GAUZE/BANDAGES/DRESSINGS) ×3 IMPLANT
DURAPREP 26ML APPLICATOR (WOUND CARE) ×3 IMPLANT
ELECT BLADE 4.0 EZ CLEAN MEGAD (MISCELLANEOUS) ×3
ELECT BLADE 6.5 EXT (BLADE) IMPLANT
ELECT REM PT RETURN 9FT ADLT (ELECTROSURGICAL) ×3
ELECTRODE BLDE 4.0 EZ CLN MEGD (MISCELLANEOUS) ×1 IMPLANT
ELECTRODE REM PT RTRN 9FT ADLT (ELECTROSURGICAL) ×1 IMPLANT
FACESHIELD WRAPAROUND (MASK) ×9 IMPLANT
FACESHIELD WRAPAROUND OR TEAM (MASK) ×2 IMPLANT
GLOVE BIOGEL PI IND STRL 8 (GLOVE) ×2 IMPLANT
GLOVE BIOGEL PI INDICATOR 8 (GLOVE) ×4
GLOVE ECLIPSE 8.0 STRL XLNG CF (GLOVE) ×3 IMPLANT
GLOVE ORTHO TXT STRL SZ7.5 (GLOVE) ×6 IMPLANT
GOWN STRL REUS W/ TWL LRG LVL3 (GOWN DISPOSABLE) ×2 IMPLANT
GOWN STRL REUS W/ TWL XL LVL3 (GOWN DISPOSABLE) ×2 IMPLANT
GOWN STRL REUS W/TWL LRG LVL3 (GOWN DISPOSABLE) ×6
GOWN STRL REUS W/TWL XL LVL3 (GOWN DISPOSABLE) ×6
HANDPIECE INTERPULSE COAX TIP (DISPOSABLE) ×3
KIT BASIN OR (CUSTOM PROCEDURE TRAY) ×3 IMPLANT
KIT ROOM TURNOVER OR (KITS) ×3 IMPLANT
MANIFOLD NEPTUNE II (INSTRUMENTS) ×3 IMPLANT
NS IRRIG 1000ML POUR BTL (IV SOLUTION) ×3 IMPLANT
PACK TOTAL JOINT (CUSTOM PROCEDURE TRAY) ×1 IMPLANT
PAD ARMBOARD 7.5X6 YLW CONV (MISCELLANEOUS) ×3 IMPLANT
RETRACTOR WND ALEXIS 18 MED (MISCELLANEOUS) ×1 IMPLANT
RTRCTR WOUND ALEXIS 18CM MED (MISCELLANEOUS) ×3
SET HNDPC FAN SPRY TIP SCT (DISPOSABLE) ×1 IMPLANT
STAPLER VISISTAT 35W (STAPLE) IMPLANT
STRIP CLOSURE SKIN 1/2X4 (GAUZE/BANDAGES/DRESSINGS) ×2 IMPLANT
SUT ETHIBOND NAB CT1 #1 30IN (SUTURE) ×3 IMPLANT
SUT MNCRL AB 3-0 PS2 18 (SUTURE) ×2 IMPLANT
SUT MNCRL AB 4-0 PS2 18 (SUTURE) IMPLANT
SUT VIC AB 0 CT1 27 (SUTURE) ×3
SUT VIC AB 0 CT1 27XBRD ANBCTR (SUTURE) ×1 IMPLANT
SUT VIC AB 1 CT1 27 (SUTURE) ×3
SUT VIC AB 1 CT1 27XBRD ANBCTR (SUTURE) ×1 IMPLANT
SUT VIC AB 2-0 CT1 27 (SUTURE) ×6
SUT VIC AB 2-0 CT1 TAPERPNT 27 (SUTURE) ×1 IMPLANT
TOWEL OR 17X24 6PK STRL BLUE (TOWEL DISPOSABLE) ×3 IMPLANT
TOWEL OR 17X26 10 PK STRL BLUE (TOWEL DISPOSABLE) ×3 IMPLANT
TRAY FOLEY CATH 16FRSI W/METER (SET/KITS/TRAYS/PACK) ×3 IMPLANT
WATER STERILE IRR 1000ML POUR (IV SOLUTION) ×2 IMPLANT

## 2015-08-04 NOTE — H&P (Signed)
TOTAL HIP ADMISSION H&P  Patient is admitted for left total hip arthroplasty.  Subjective:  Chief Complaint: left hip pain  HPI: Tara Acosta, 65 y.o. female, has a history of pain and functional disability in the left hip(s) due to arthritis and patient has failed non-surgical conservative treatments for greater than 12 weeks to include NSAID's and/or analgesics, corticosteriod injections, flexibility and strengthening excercises and activity modification.  Onset of symptoms was gradual starting 2 years ago with gradually worsening course since that time.The patient noted no past surgery on the left hip(s).  Patient currently rates pain in the left hip at 9 out of 10 with activity. Patient has night pain, worsening of pain with activity and weight bearing, pain that interfers with activities of daily living and pain with passive range of motion. Patient has evidence of subchondral cysts, subchondral sclerosis, periarticular osteophytes and joint space narrowing by imaging studies. This condition presents safety issues increasing the risk of falls.  There is no current active infection.  Patient Active Problem List   Diagnosis Date Noted  . Osteoarthritis of left hip 08/04/2015  . Degenerative arthritis of hip 06/26/2012  . History of melanoma 10/03/2011  . GERD (gastroesophageal reflux disease) 09/29/2010  . 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 OSTEOARTHRITIS 02/06/2007  . NEPHROLITHIASIS, HX OF 02/06/2007   Past Medical History:  Diagnosis Date  . Family history of anesthesia complication    family /w N&V  . GERD (gastroesophageal reflux disease)   . NEPHROLITHIASIS, HX OF 02/06/2007   cystoscopy- with anesth.   . OSTEOARTHRITIS 02/06/2007  . 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 COLONOSCOPY  2009  . TOTAL HIP ARTHROPLASTY Right 06/26/2012   Procedure: RIGHT TOTAL HIP ARTHROPLASTY ANTERIOR APPROACH;  Surgeon: Kathryne Hitch, MD;  Location: [facility]  OR;  Service: Orthopedics;  Laterality: Right;  . TUBAL LIGATION      Prescriptions Prior to Admission  Medication Sig Dispense Refill Last Dose  . ibuprofen (ADVIL,MOTRIN) 200 MG tablet Take 200-400 mg by mouth every 6 (six) hours as needed for moderate pain.    Taking  . Multiple Vitamin (MULTIVITAMIN) tablet Take 1 tablet by mouth daily.   Taking  . Ranitidine HCl (ZANTAC 75 PO) Take 1 tablet by mouth 2 (two) times daily as needed. Acid reflux   Taking  . vitamin C (ASCORBIC ACID) 500 MG tablet Take 500 mg by mouth daily.   Taking   Allergies  Allergen Reactions  . Oxycodone Nausea Only    Social History  Substance Use Topics  . Smoking status: Never Smoker  . Smokeless tobacco: Never Used  . Alcohol use Yes     Comment: social    Family History  Problem Relation Age of Onset  . Gout Father   . Hypertension Father   . Dementia Father   . Hypertension Mother   . Coronary artery disease Mother   . Pancreatic cancer      Uncle     Review of Systems  Musculoskeletal: Positive for joint pain.  All other systems reviewed and are negative.   Objective:  Physical Exam  Constitutional: She is oriented to person, place, and time. She appears well-developed and well-nourished.  HENT:  Head: Normocephalic and atraumatic.  Eyes: EOM are normal. Pupils are equal, round, and reactive to light.  Neck: Normal range of motion. Neck supple.  Cardiovascular: Normal rate and regular rhythm.   Respiratory: Effort normal and breath sounds normal.  GI: Soft. Bowel sounds are normal.  Musculoskeletal:       Left hip: She exhibits decreased range of motion, decreased strength, tenderness and bony tenderness.  Neurological: She is alert and oriented to person, place, and time.  Skin: Skin is warm and dry.  Psychiatric: She has a normal mood and affect.    Vital signs in last 24 hours:    Labs:   Estimated body mass index is 24.85 kg/m as calculated from the following:   Height  as of 07/24/15: 5\' 8"  (1.727 m).   Weight as of 07/24/15: 74.1 kg (163 lb 7 oz).   Imaging Review Plain radiographs demonstrate severe degenerative joint disease of the left hip(s). The bone quality appears to be excellent for age and reported activity level.  Assessment/Plan:  End stage arthritis, left hip(s)  The patient history, physical examination, clinical judgement of the provider and imaging studies are consistent with end stage degenerative joint disease of the left hip(s) and total hip arthroplasty is deemed medically necessary. The treatment options including medical management, injection therapy, arthroscopy and arthroplasty were discussed at length. The risks and benefits of total hip arthroplasty were presented and reviewed. The risks due to aseptic loosening, infection, stiffness, dislocation/subluxation,  thromboembolic complications and other imponderables were discussed.  The patient acknowledged the explanation, agreed to proceed with the plan and consent was signed. Patient is being admitted for inpatient treatment for surgery, pain control, PT, OT, prophylactic antibiotics, VTE prophylaxis, progressive ambulation and ADL's and discharge planning.The patient is planning to be discharged home with home health services

## 2015-08-04 NOTE — Transfer of Care (Signed)
Immediate Anesthesia Transfer of Care Note  Patient: Tara Acosta  Procedure(s) Performed: Procedure(s): LEFT TOTAL HIP ARTHROPLASTY ANTERIOR APPROACH (Left)  Patient Location: PACU  Anesthesia Type:MAC and Spinal  Level of Consciousness: awake, alert , oriented and patient cooperative  Airway & Oxygen Therapy: Patient Spontanous Breathing  Post-op Assessment: Report given to RN and Post -op Vital signs reviewed and stable  Post vital signs: Reviewed and stable  Last Vitals:  Vitals:   08/04/15 1224  BP: (!) 147/77  Pulse: 84  Resp: 18  Temp: 36.8 C    Last Pain:  Vitals:   08/04/15 1224  TempSrc: Oral         Complications: No apparent anesthesia complications

## 2015-08-04 NOTE — Anesthesia Procedure Notes (Signed)
Spinal  Patient location during procedure: OR Start time: 08/04/2015 1:54 PM End time: 08/04/2015 1:58 PM Staffing Anesthesiologist: Gaynelle Adu Performed: anesthesiologist  Preanesthetic Checklist Completed: patient identified, surgical consent, pre-op evaluation, timeout performed, IV checked, risks and benefits discussed and monitors and equipment checked Spinal Block Patient position: sitting Prep: Betadine Patient monitoring: cardiac monitor, continuous pulse ox and blood pressure Approach: midline Location: L3-4 Injection technique: single-shot Needle Needle type: Pencan  Needle gauge: 24 G Needle length: 9 cm Assessment Sensory level: T6 Additional Notes Functioning IV was confirmed and monitors were applied. Sterile prep and drape, including hand hygiene and sterile gloves were used. The patient was positioned and the spine was prepped. The skin was anesthetized with lidocaine.  Free flow of clear CSF was obtained prior to injecting local anesthetic into the CSF.  The spinal needle aspirated freely following injection.  The needle was carefully withdrawn.  The patient tolerated the procedure well.

## 2015-08-04 NOTE — Brief Op Note (Signed)
08/04/2015  3:26 PM  PATIENT:  Tara Acosta  65 y.o. female  PRE-OPERATIVE DIAGNOSIS:  osteoarthritis left hip  POST-OPERATIVE DIAGNOSIS:  osteoarthritis left hip  PROCEDURE:  Procedure(s): LEFT TOTAL HIP ARTHROPLASTY ANTERIOR APPROACH (Left)  SURGEON:  Surgeon(s) and Role:    * Kathryne Hitch, MD - Primary  PHYSICIAN ASSISTANT: Rexene Edison, PA-C  ANESTHESIA:   spinal  EBL:  Total I/O In: 2000 [I.V.:2000] Out: 400 [Urine:200; Blood:200]  COUNTS:  YES  DICTATION: .Other Dictation: Dictation Number [id number]  PLAN OF CARE: Admit to inpatient   PATIENT DISPOSITION:  PACU - hemodynamically stable.   Delay start of Pharmacological VTE agent (>24hrs) due to surgical blood loss or risk of bleeding: no

## 2015-08-04 NOTE — Op Note (Signed)
Tara Acosta, REDDIN NO.:  [id number]  MEDICAL RECORD NO.:  [id number]  LOCATION:  [facility]                         FACILITY:  [facility]  PHYSICIAN:  Vanita Panda. Magnus Ivan, M.D.DATE OF BIRTH:  [birth year]  DATE OF PROCEDURE:  08/04/2015 DATE OF DISCHARGE:                              OPERATIVE REPORT   PREOPERATIVE DIAGNOSES:  Primary osteoarthritis and degenerative joint disease, left hip.  POSTOPERATIVE DIAGNOSES:  Primary osteoarthritis and degenerative joint disease, left hip.  PROCEDURE:  Left total hip arthroplasty through direct anterior approach.  IMPLANTS:  DePuy Sector Gription acetabular component size 50, size 32+0 neutral polyethylene liner, size 11 Corail femoral component with varus offset (KLA), size 32+1 ceramic hip ball.  SURGEON:  Vanita Panda. Magnus Ivan, M.D.  ASSISTANT:  Richardean Canal, PA-C.  ANESTHESIA:  Spinal.  ANTIBIOTICS:  200 mg of IV Ancef.  BLOOD LOSS:  Less than 300 mL.  COMPLICATIONS:  None.  INDICATIONS:  Ms. Whetsel is a 65 year old female with debilitating arthritis involving her left hip.  She actually underwent a direct anterior right total hip arthroplasty about 3 to 4 years ago by me and her left hip was showing some wear at that time.  She has gotten to the point now where the wear is more significant, it is detriment, in fact it has affected her activities of daily living, her quality of life, and her mobility.  At this point, with failure of conservative treatment measures, she does wish to proceed with a total hip arthroplasty.  She fully understands the risks and benefits of this having had done this before and she understands our goals are decreased pain, improved mobility, and overall improved quality of life.  PROCEDURE DESCRIPTION:  After informed consent was obtained, appropriate left hip was marked.  She was brought to the operating room where spinal anesthesia was obtained while she was on her  stretcher.  Foley catheter was placed and both feet had traction boots applied to them.  Next, she was placed supine on the HANA fracture table with the perineal post in place and both legs in inline skeletal traction devices, but no traction applied.  Her left operative hip was prepped and draped with DuraPrep and sterile drapes.  Time-out was called, she was identified as correct patient and correct left hip.  I then made my incision inferior and posterior to the anterior superior iliac spine and carried this obliquely down the leg.  We dissected down tensor fascia lata muscle and tensor fascia was then divided longitudinally so we could proceed with direct anterior approach to the hip.  We identified and cauterized the circumflex vessels and then identified the hip capsule opening the hip capsule in an L-type format finding significant arthritis in the hip joint.  We placed curved retractors within the joint capsule and made our femoral neck cut with an oscillating saw and completed this with an osteotome.  We placed a corkscrew guide in the femoral head and removed the femoral head in its entirety and found it to be completely devoid of cartilage.  We then placed a bent Hohmann over the medial acetabular rim and removed remnants of acetabular labrum.  We then began reaming  under direct visualization from a size 42 reamer up to a size 50 with all reamers under direct visualization, the last reamer under direct fluoroscopy, so we could obtain our depth of reaming, our inclination, and anteversion.  Once we were pleased with this, we placed the real DePuy Sector Gription acetabular component size 50 and a 32+0 neutral polyethylene liner for that size acetabular component.  Attention was then turned to the femur.  With the leg externally rotated to 120 degrees, extended and adducted, we were able to place a Mueller retractor medially and a Hohmann retractor behind the greater trochanter.   We released the lateral joint capsule and used a box cutting osteotome to enter the femoral canal and a rongeur to lateralize.  We then began broaching from a size 8 broach using the Corail broaching system up to a size 11.  With the size 11, we trialed a varus offset femoral neck and a 32+ 1 hip ball.  We brought the leg back over and up with traction and rotation reducing the pelvis.  We were pleased with leg length, offset, range of motion, and stability.  We then dislocated the hip and removed the trial components.  We placed the real Corail femoral component with varus offset size 11 followed by the 32+1 ceramic hip ball and reduced this into the acetabulum and it was stable.  We then copiously irrigated the soft tissue with normal saline solution using pulsatile lavage.  We were able to close the joint capsule with interrupted #1 Ethibond suture followed by running #1 Vicryl in the tensor fascia, 0-Vicryl in the deep tissue, 2-0 Vicryl in the subcutaneous tissue, 4-0 Monocryl subcuticular stitch, and Steri- Strips on the skin.  An Aquacel dressing was applied.  She was taken off the HANA table and taken to the recovery room in stable condition.  All final counts were correct.  There were no complications noted.  Of note, Richardean Canal, PA-C assisted in entire case.  His assistance was crucial for facilitating all aspects of this case.     Vanita Panda. Magnus Ivan, M.D.     CYB/MEDQ  D:  08/04/2015  T:  08/04/2015  Job:  [id number]

## 2015-08-04 NOTE — Anesthesia Procedure Notes (Signed)
Procedure Name: MAC Date/Time: 08/04/2015 1:55 PM Performed by: Rosiland Oz Pre-anesthesia Checklist: Patient identified, Emergency Drugs available, Suction available, Patient being monitored and Timeout performed Patient Re-evaluated:Patient Re-evaluated prior to inductionOxygen Delivery Method: Nasal cannula Intubation Type: IV induction Dental Injury: Teeth and Oropharynx as per pre-operative assessment

## 2015-08-04 NOTE — Anesthesia Preprocedure Evaluation (Addendum)
Anesthesia Evaluation  Patient identified by MRN, date of birth, ID band Patient awake    Reviewed: Allergy & Precautions, H&P , NPO status , Patient's Chart, lab work & pertinent test results  Airway Mallampati: I  TM Distance: >3 FB Neck ROM: Full    Dental no notable dental hx. (+) Teeth Intact, Dental Advisory Given   Pulmonary neg pulmonary ROS,    Pulmonary exam normal breath sounds clear to auscultation       Cardiovascular negative cardio ROS   Rhythm:Regular Rate:Normal     Neuro/Psych negative neurological ROS  negative psych ROS   GI/Hepatic Neg liver ROS, GERD  Medicated and Controlled,  Endo/Other  negative endocrine ROS  Renal/GU negative Renal ROS  negative genitourinary   Musculoskeletal  (+) Arthritis , Osteoarthritis,    Abdominal   Peds  Hematology negative hematology ROS (+)   Anesthesia Other Findings   Reproductive/Obstetrics negative OB ROS                            Anesthesia Physical Anesthesia Plan  ASA: II  Anesthesia Plan: MAC and Spinal   Post-op Pain Management:    Induction: Intravenous  Airway Management Planned: Simple Face Mask  Additional Equipment:   Intra-op Plan:   Post-operative Plan:   Informed Consent: I have reviewed the patients History and Physical, chart, labs and discussed the procedure including the risks, benefits and alternatives for the proposed anesthesia with the patient or authorized representative who has indicated his/her understanding and acceptance.   Dental advisory given  Plan Discussed with: CRNA  Anesthesia Plan Comments:         Anesthesia Quick Evaluation

## 2015-08-04 NOTE — Anesthesia Postprocedure Evaluation (Signed)
Anesthesia Post Note  Patient: Tara Acosta  Procedure(s) Performed: Procedure(s) (LRB): LEFT TOTAL HIP ARTHROPLASTY ANTERIOR APPROACH (Left)  Patient location during evaluation: PACU Anesthesia Type: Spinal and MAC Level of consciousness: awake and alert Pain management: pain level controlled Vital Signs Assessment: post-procedure vital signs reviewed and stable Respiratory status: spontaneous breathing and respiratory function stable Cardiovascular status: blood pressure returned to baseline and stable Postop Assessment: spinal receding Anesthetic complications: no    Last Vitals:  Vitals:   08/04/15 1700 08/04/15 1734  BP: (!) 127/59 130/70  Pulse: (!) 55 90  Resp: 10 16  Temp:      Last Pain:  Vitals:   08/04/15 1645  TempSrc:   PainSc: 6                  Anes Rigel,W. EDMOND

## 2015-08-05 ENCOUNTER — Encounter (HOSPITAL_COMMUNITY): Payer: Self-pay | Admitting: General Practice

## 2015-08-05 LAB — CBC
HEMATOCRIT: 34.3 % — AB (ref 36.0–46.0)
HEMOGLOBIN: 11.1 g/dL — AB (ref 12.0–15.0)
MCH: 30.4 pg (ref 26.0–34.0)
MCHC: 32.4 g/dL (ref 30.0–36.0)
MCV: 94 fL (ref 78.0–100.0)
Platelets: 175 10*3/uL (ref 150–400)
RBC: 3.65 MIL/uL — ABNORMAL LOW (ref 3.87–5.11)
RDW: 12.4 % (ref 11.5–15.5)
WBC: 5.9 10*3/uL (ref 4.0–10.5)

## 2015-08-05 LAB — BASIC METABOLIC PANEL
ANION GAP: 5 (ref 5–15)
BUN: 8 mg/dL (ref 6–20)
CHLORIDE: 105 mmol/L (ref 101–111)
CO2: 29 mmol/L (ref 22–32)
CREATININE: 0.73 mg/dL (ref 0.44–1.00)
Calcium: 8.5 mg/dL — ABNORMAL LOW (ref 8.9–10.3)
GFR calc non Af Amer: >60 mL/min (ref >60)
Glucose, Bld: 124 mg/dL — ABNORMAL HIGH (ref 65–99)
Potassium: 4.1 mmol/L (ref 3.5–5.1)
SODIUM: 139 mmol/L (ref 135–145)

## 2015-08-05 MED ORDER — PANTOPRAZOLE SODIUM 40 MG PO TBEC
40.0000 mg | DELAYED_RELEASE_TABLET | Freq: Every day | ORAL | Status: DC
  Administered 2015-08-05 – 2015-08-06 (×2): 40 mg via ORAL
  Filled 2015-08-05 (×2): qty 1

## 2015-08-05 MED ORDER — HYDROCODONE-ACETAMINOPHEN 5-325 MG PO TABS
1.0000 | ORAL_TABLET | ORAL | Status: DC | PRN
  Administered 2015-08-05 – 2015-08-06 (×6): 2 via ORAL
  Filled 2015-08-05 (×6): qty 2

## 2015-08-05 NOTE — Progress Notes (Signed)
Subjective: 1 Day Post-Op Procedure(s) (LRB): LEFT TOTAL HIP ARTHROPLASTY ANTERIOR APPROACH (Left) Patient reports pain as moderate.    Objective: Vital signs in last 24 hours: Temp:  [97.5 F (36.4 C)-98.3 F (36.8 C)] 97.6 F (36.4 C) (08/02 0606) Pulse Rate:  [55-90] 87 (08/02 0606) Resp:  [10-18] 16 (08/02 0606) BP: (105-147)/(54-77) 117/54 (08/02 0606) SpO2:  [97 %-100 %] 97 % (08/02 0606) Weight:  [73.9 kg (163 lb)] 73.9 kg (163 lb) (08/01 1224)  Intake/Output from previous day: 08/01 0701 - 08/02 0700 In: 2300 [I.V.:2300] Out: 1700 [Urine:1500; Blood:200] Intake/Output this shift: No intake/output data recorded.   Recent Labs  08/05/15 0524  HGB 11.1*    Recent Labs  08/05/15 0524  WBC 5.9  RBC 3.65*  HCT 34.3*  PLT 175    Recent Labs  08/05/15 0524  NA 139  K 4.1  CL 105  CO2 29  BUN 8  CREATININE 0.73  GLUCOSE 124*  CALCIUM 8.5*   No results for input(s): LABPT, INR in the last 72 hours.  Sensation intact distally Intact pulses distally Dorsiflexion/Plantar flexion intact Incision: dressing C/D/I  Assessment/Plan: 1 Day Post-Op Procedure(s) (LRB): LEFT TOTAL HIP ARTHROPLASTY ANTERIOR APPROACH (Left) Up with therapy  Taden Witter Y 08/05/2015, 7:08 AM

## 2015-08-05 NOTE — Evaluation (Signed)
Physical Therapy Evaluation Patient Details Name: Tara Acosta MRN: [id number] DOB: Sep 09, 1950 Today's Date: 08/05/2015   History of Present Illness  Pt is a 65 y/o female s/p L THA (anterior approach). PMH including but not limited to R THA in 2014.  Clinical Impression  Pt presented supine in bed with HOB elevated, awake and willing to participate in therapy session. Pt reported 7/10 pain in L hip and thigh as well as nausea at beginning of session. Pt able to transfer from bed to recliner with min guard for safety; however, unable to take steps this session secondary to increased pain. Pt would continue to benefit from skilled physical therapy services at this time while admitted and after d/c to address her below listed limitations in order to improve her overall safety and independence with functional mobility. PT plan to perform gait training at next visit.      Follow Up Recommendations Home health PT;Supervision/Assistance - 24 hour    Equipment Recommendations  None recommended by PT;Other (comment) (pt reported having all necessary equipment at home)    Recommendations for Other Services       Precautions / Restrictions Precautions Precautions: Fall Restrictions Weight Bearing Restrictions: Yes LLE Weight Bearing: Weight bearing as tolerated      Mobility  Bed Mobility Overal bed mobility: Needs Assistance Bed Mobility: Supine to Sit     Supine to sit: Min assist;HOB elevated     General bed mobility comments: Pt required increased time and min A with L LE  Transfers Overall transfer level: Needs assistance Equipment used: Rolling walker (2 wheeled) Transfers: Stand Pivot Transfers   Stand pivot transfers: Min guard       General transfer comment: Pt required increased time and VC'ing for bilateral hand placement  Ambulation/Gait             General Gait Details: did not occur secondary to pt c/o of increased pain and requesting to sit  Stairs             Wheelchair Mobility    Modified Rankin (Stroke Patients Only)       Balance Overall balance assessment: Needs assistance Sitting-balance support: Feet supported;Bilateral upper extremity supported Sitting balance-Leahy Scale: Poor     Standing balance support: Bilateral upper extremity supported Standing balance-Leahy Scale: Poor                               Pertinent Vitals/Pain Pain Assessment: 0-10 Pain Score: 7  Pain Location: L hip and L thigh Pain Descriptors / Indicators: Discomfort;Grimacing;Guarding;Sore Pain Intervention(s): Monitored during session;Repositioned;Ice applied    Home Living Family/patient expects to be discharged to:: Private residence Living Arrangements: Spouse/significant other Available Help at Discharge: Family;Available PRN/intermittently Type of Home: House Home Access: Stairs to enter Entrance Stairs-Rails: None Entrance Stairs-Number of Steps: 1 Home Layout: One level Home Equipment: Walker - 2 wheels;Bedside commode Additional Comments: pt kept all equipment from previous surgery    Prior Function Level of Independence: Independent               Hand Dominance        Extremity/Trunk Assessment   Upper Extremity Assessment: Defer to OT evaluation           Lower Extremity Assessment: LLE deficits/detail   LLE Deficits / Details: Pt with decreased strength and ROM limitations secondary to post-op.     Communication   Communication: No difficulties  Cognition  Arousal/Alertness: Awake/alert Behavior During Therapy: WFL for tasks assessed/performed Overall Cognitive Status: Within Functional Limits for tasks assessed                      General Comments      Exercises Total Joint Exercises Ankle Circles/Pumps: AROM;Left;10 reps;Seated Quad Sets: AROM;Strengthening;Left;10 reps Long Arc Quad: AROM;Strengthening;Left;10 reps      Assessment/Plan    PT Assessment  Patient needs continued PT services  PT Diagnosis Difficulty walking   PT Problem List Decreased strength;Decreased range of motion;Decreased activity tolerance;Decreased balance;Decreased mobility;Decreased coordination;Decreased knowledge of use of DME;Pain  PT Treatment Interventions DME instruction;Gait training;Stair training;Functional mobility training;Therapeutic exercise;Therapeutic activities;Balance training;Patient/family education   PT Goals (Current goals can be found in the Care Plan section) Acute Rehab PT Goals Patient Stated Goal: return home PT Goal Formulation: With patient/family Time For Goal Achievement: 08/12/15 Potential to Achieve Goals: Good    Frequency 7X/week   Barriers to discharge        Co-evaluation               End of Session Equipment Utilized During Treatment: Gait belt Activity Tolerance: Patient limited by fatigue;Patient limited by pain Patient left: in chair;with call bell/phone within reach;with family/visitor present Nurse Communication: Mobility status         Time: 2536-6440 PT Time Calculation (min) (ACUTE ONLY): 15 min   Charges:   PT Evaluation $PT Eval Moderate Complexity: 1 Procedure     PT G CodesAlessandra Bevels Ottie Neglia 08/05/2015, 10:24 AM [name], PT, DPT [phone]

## 2015-08-05 NOTE — Progress Notes (Signed)
Physical Therapy Treatment Patient Details Name: Tara Acosta MRN: [id number] DOB: 08/21/1950 Today's Date: 08/05/2015    History of Present Illness Pt is a 66 y/o female s/p L THA (anterior approach). PMH including but not limited to R THA in 2014.    PT Comments    Pt presented supine in bed with HOB elevated, awake and willing to participate in therapy session. Pt reported that she continues to have nausea but denied having any pain at beginning of session. Pt making good progress towards achieving her goals. During this session, pt able to ambulate 30 ft with min guard for safety only. Pt would continue to benefit from skilled physical therapy services at this time while admitted and after d/c to address her limitations in order to improve her overall safety and independence with functional mobility. PT plan to perform stair training at next session.   Follow Up Recommendations  Home health PT;Supervision/Assistance - 24 hour     Equipment Recommendations  None recommended by PT;Other (comment) (pt reported having all necessary equipment at home)    Recommendations for Other Services       Precautions / Restrictions Precautions Precautions: Fall Precaution Comments: nauseated today Restrictions Weight Bearing Restrictions: Yes LLE Weight Bearing: Weight bearing as tolerated    Mobility  Bed Mobility Overal bed mobility: Needs Assistance Bed Mobility: Supine to Sit     Supine to sit: HOB elevated;Min guard     General bed mobility comments: pt required increased time  Transfers Overall transfer level: Needs assistance Equipment used: Rolling walker (2 wheeled) Transfers: Sit to/from Stand Sit to Stand: Min guard Stand pivot transfers: Min guard       General transfer comment: pt required increased time  Ambulation/Gait Ambulation/Gait assistance: Min guard Ambulation Distance (Feet): 30 Feet Assistive device: Rolling walker (2 wheeled) Gait  Pattern/deviations: Step-through pattern;Decreased step length - right;Decreased stance time - left;Decreased weight shift to left Gait velocity: decreased Gait velocity interpretation: Below normal speed for age/gender General Gait Details: did not occur secondary to pt c/o of increased pain and requesting to sit   Stairs            Wheelchair Mobility    Modified Rankin (Stroke Patients Only)       Balance Overall balance assessment: Needs assistance Sitting-balance support: Feet supported;No upper extremity supported Sitting balance-Leahy Scale: Fair     Standing balance support: Bilateral upper extremity supported;During functional activity Standing balance-Leahy Scale: Poor Standing balance comment: reliant on RW                    Cognition Arousal/Alertness: Awake/alert Behavior During Therapy: WFL for tasks assessed/performed Overall Cognitive Status: Within Functional Limits for tasks assessed                      Exercises Total Joint Exercises Ankle Circles/Pumps: AROM;Left;10 reps;Seated Quad Sets: AROM;Strengthening;Left;10 reps Heel Slides: AAROM;Strengthening;Left;10 reps;Seated Hip ABduction/ADduction: AROM;Strengthening;Left;10 reps;Seated Long Arc Quad: AROM;Strengthening;Left;10 reps Marching in Standing: AROM;Strengthening;Both;10 reps;Seated    General Comments        Pertinent Vitals/Pain Pain Assessment: No/denies pain Pain Score: 6  Pain Location: left hip and thigh Pain Descriptors / Indicators: Grimacing;Guarding;Sore Pain Intervention(s): Monitored during session    Home Living Family/patient expects to be discharged to:: Private residence Living Arrangements: Spouse/significant other Available Help at Discharge: Family;Available PRN/intermittently Type of Home: House Home Access: Stairs to enter Entrance Stairs-Rails: None Home Layout: One level Home Equipment: Environmental consultant - 2  wheels;Bedside commode;Walker -  standard Additional Comments: pt kept all equipment from previous surgery    Prior Function Level of Independence: Independent          PT Goals (current goals can now be found in the care plan section) Acute Rehab PT Goals Patient Stated Goal: return home PT Goal Formulation: With patient/family Time For Goal Achievement: 08/12/15 Potential to Achieve Goals: Good Progress towards PT goals: Progressing toward goals    Frequency  7X/week    PT Plan Current plan remains appropriate    Co-evaluation             End of Session Equipment Utilized During Treatment: Gait belt Activity Tolerance: Patient limited by fatigue Patient left: in chair;with call bell/phone within reach;with family/visitor present     Time: 1610-9604 PT Time Calculation (min) (ACUTE ONLY): 20 min  Charges:  $Gait Training: 8-22 mins                    G CodesAlessandra Bevels Chasten Blaze 2015-09-02, 12:17 PM [name], PT, DPT [phone]

## 2015-08-05 NOTE — Care Management Note (Signed)
Case Management Note  Patient Details  Name: Tara Acosta MRN: [id number] Date of Birth: 04/04/1950  Subjective/Objective:   65 yr old female s/p left total hip arthroplasty, anterior approach.                 Action/Plan: Case manager spoke with patient and her husband concerning home health and DMe needs. Choice was offered. Patient was preoperatively setup with [facility], no changes. Patient states she has rolling walker and 3in1 at home. She will have family support at discharge.  Expected Discharge Date:   08/06/15               Expected Discharge Plan:  Home w Home Health Services  In-House Referral:  NA  Discharge planning Services  CM Consult  Post Acute Care Choice:  Home Health Choice offered to:  Patient  DME Arranged:  N/A DME Agency:  NA  HH Arranged:  PT HH Agency:  [facility] (now [facility])  Status of Service:  Completed, signed off  If discussed at [employer] of Stay Meetings, dates discussed:    Additional Comments:  Durenda Guthrie, RN 08/05/2015, 11:31 AM

## 2015-08-05 NOTE — Evaluation (Signed)
Occupational Therapy Evaluation Patient Details Name: Tara Acosta MRN: [id number] DOB: 08/12/1950 Today's Date: 08/05/2015    History of Present Illness Pt is a 65 y/o female s/p L THA (anterior approach). PMH including but not limited to R THA in 2014.   Clinical Impression   This 65 yo female admitted and underwent above presents to acute OT with all education complete, we will D/C from acute OT. Pt reported that she felt a little nauseous, I gave her an alcohol swab to keep smelling and told her that if it did not subside to let RN know.    Follow Up Recommendations  No OT follow up    Equipment Recommendations  None recommended by OT       Precautions / Restrictions Precautions Precautions: Fall Precaution Comments: nauseated today Restrictions Weight Bearing Restrictions: No LLE Weight Bearing: Weight bearing as tolerated      Mobility Bed Mobility     General bed mobility comments: Pt up in recliner upon my arrival  Transfers Overall transfer level: Needs assistance Equipment used: Rolling walker (2 wheeled) Transfers: Sit to/from Stand Sit to Stand: Min guard          Balance Overall balance assessment: Needs assistance Sitting-balance support: Feet supported;No upper extremity supported Sitting balance-Leahy Scale: Fair     Standing balance support: Bilateral upper extremity supported;During functional activity Standing balance-Leahy Scale: Poor Standing balance comment: reliant on RW                            ADL Overall ADL's : Needs assistance/impaired Eating/Feeding: Independent;Sitting   Grooming: Min guard;Standing   Upper Body Bathing: Set up;Sitting   Lower Body Bathing: Moderate assistance (min guard sit<>stand)   Upper Body Dressing : Set up;Sitting   Lower Body Dressing: Moderate assistance (min guard sit<>stand)   Toilet Transfer: Min guard;Ambulation;RW;BSC (over toilet)   Toileting- Clothing Manipulation and  Hygiene: Min guard;Sit to/from stand     Tub/Shower Transfer Details (indicate cue type and reason): We discussed her set up of shower stall in her bathroom and she and husband report that it is too small to put any sort of seat in it. She said last time she had the other hip down she used a standard walker with her in the shower and she plans on doing this again as needed.   General ADL Comments: I educated pt on most efficient manner of getting full dressed as well as the sequence for LBD               Pertinent Vitals/Pain Pain Assessment: 0-10 Pain Score: 6  Pain Location: left hip and thigh Pain Descriptors / Indicators: Grimacing;Guarding;Sore Pain Intervention(s): Monitored during session;Premedicated before session;Ice applied     Hand Dominance Right   Extremity/Trunk Assessment Upper Extremity Assessment Upper Extremity Assessment: Overall WFL for tasks assessed     Communication Communication Communication: No difficulties   Cognition Arousal/Alertness: Awake/alert Behavior During Therapy: WFL for tasks assessed/performed Overall Cognitive Status: Within Functional Limits for tasks assessed                                Home Living Family/patient expects to be discharged to:: Private residence Living Arrangements: Spouse/significant other Available Help at Discharge: Family;Available PRN/intermittently Type of Home: House Home Access: Stairs to enter [employer] of Steps: 1 Entrance Stairs-Rails: None Home Layout: One level  Bathroom Shower/Tub: Psychologist, counselling;Door   [employer] Toilet: Standard     Home Equipment: Environmental consultant - 2 wheels;Bedside commode;Walker - standard   Additional Comments: pt kept all equipment from previous surgery      Prior Functioning/Environment Level of Independence: Independent             OT Diagnosis: Generalized weakness;Acute pain         OT Goals(Current goals can be found in the care  plan section) Acute Rehab OT Goals Patient Stated Goal: return home  OT Frequency:                End of Session Equipment Utilized During Treatment: Gait belt;Rolling walker Nurse Communication:  (NT: pt urinated)  Activity Tolerance: Patient tolerated treatment well Patient left: in chair;with call bell/phone within reach;with family/visitor present   Time: 0950-1009 OT Time Calculation (min): 19 min Charges:  OT General Charges $OT Visit: 1 Procedure OT Evaluation $OT Eval Moderate Complexity: 1 Procedure  Evette Georges [phone] 08/05/2015, 10:54 AM

## 2015-08-06 MED ORDER — ONDANSETRON 4 MG PO TBDP: 4.0000 mg | ORAL_TABLET | Freq: Three times a day (TID) | ORAL | 0 refills | Status: DC | PRN

## 2015-08-06 MED ORDER — METHOCARBAMOL 500 MG PO TABS: 500.0000 mg | ORAL_TABLET | Freq: Four times a day (QID) | ORAL | 0 refills | Status: DC | PRN

## 2015-08-06 MED ORDER — ASPIRIN 325 MG PO TBEC: 325.0000 mg | DELAYED_RELEASE_TABLET | Freq: Two times a day (BID) | ORAL | 0 refills | Status: DC

## 2015-08-06 MED ORDER — HYDROCODONE-ACETAMINOPHEN 5-325 MG PO TABS: 1.0000 | ORAL_TABLET | ORAL | 0 refills | Status: DC | PRN

## 2015-08-06 NOTE — Discharge Summary (Signed)
Patient ID: Tara Acosta MRN: [id number] DOB/AGE: [birth year] 65 y.o.  Admit date: 08/04/2015 Discharge date: 08/06/2015  Admission Diagnoses:  Principal Problem:   Osteoarthritis of left hip Active Problems:   Status post left hip replacement   Discharge Diagnoses:  Same  Past Medical History:  Diagnosis Date  . Family history of anesthesia complication    family /w N&V  . GERD (gastroesophageal reflux disease)   . NEPHROLITHIASIS, HX OF 02/06/2007   cystoscopy- with anesth.   . OSTEOARTHRITIS 02/06/2007  . TMJ SYNDROME 02/27/2009    Surgeries: Procedure(s): LEFT TOTAL HIP ARTHROPLASTY ANTERIOR APPROACH on 08/04/2015   Consultants:   Discharged Condition: Improved  Hospital Course: MARIZOL BORROR is an 65 y.o. female who was admitted 08/04/2015 for operative treatment ofOsteoarthritis of left hip. Patient has severe unremitting pain that affects sleep, daily activities, and work/hobbies. After pre-op clearance the patient was taken to the operating room on 08/04/2015 and underwent  Procedure(s): LEFT TOTAL HIP ARTHROPLASTY ANTERIOR APPROACH.    Patient was given perioperative antibiotics: Anti-infectives    Start     Dose/Rate Route Frequency Ordered Stop   08/04/15 2000  ceFAZolin (ANCEF) IVPB 1 g/50 mL premix     1 g 100 mL/hr over 30 Minutes Intravenous Every 6 hours 08/04/15 1734 08/05/15 0247   08/04/15 1230  ceFAZolin (ANCEF) IVPB 2g/100 mL premix     2 g 200 mL/hr over 30 Minutes Intravenous To ShortStay Surgical 08/04/15 1224 08/04/15 1408       Patient was given sequential compression devices, early ambulation, and chemoprophylaxis to prevent DVT.  Patient benefited maximally from hospital stay and there were no complications.    Recent vital signs: Patient Vitals for the past 24 hrs:  BP Temp Temp src Pulse Resp SpO2  08/06/15 0637 (!) 118/55 98.6 F (37 C) Oral 87 16 97 %  08/05/15 2027 124/69 98.9 F (37.2 C) Oral 91 16 96 %  08/05/15 1500 122/62 98.5 F  (36.9 C) Oral 81 16 98 %     Recent laboratory studies:  Recent Labs  08/05/15 0524  WBC 5.9  HGB 11.1*  HCT 34.3*  PLT 175  NA 139  K 4.1  CL 105  CO2 29  BUN 8  CREATININE 0.73  GLUCOSE 124*  CALCIUM 8.5*     Discharge Medications:     Medication List    TAKE these medications   aspirin 325 MG EC tablet Take 1 tablet (325 mg total) by mouth 2 (two) times daily after a meal.   HYDROcodone-acetaminophen 5-325 MG tablet Commonly known as:  NORCO/VICODIN Take 1-2 tablets by mouth every 4 (four) hours as needed for severe pain.   ibuprofen 200 MG tablet Commonly known as:  ADVIL,MOTRIN Take 200-400 mg by mouth every 6 (six) hours as needed for moderate pain.   methocarbamol 500 MG tablet Commonly known as:  ROBAXIN Take 1 tablet (500 mg total) by mouth every 6 (six) hours as needed for muscle spasms.   multivitamin tablet Take 1 tablet by mouth daily.   ondansetron 4 MG disintegrating tablet Commonly known as:  ZOFRAN ODT Take 1 tablet (4 mg total) by mouth every 8 (eight) hours as needed for nausea or vomiting.   vitamin C 500 MG tablet Commonly known as:  ASCORBIC ACID Take 500 mg by mouth daily.   ZANTAC 75 PO Take 1 tablet by mouth 2 (two) times daily as needed. Acid reflux       Diagnostic Studies:  Dg C-arm 1-60 Min  Result Date: 08/04/2015 CLINICAL DATA:  Left hip degenerative change EXAM: OPERATIVE LEFT HIP WITH PELVIS; DG C-ARM 61-120 MIN COMPARISON:  None. FLUOROSCOPY TIME:  Radiation Exposure Index (as provided by the fluoroscopic device): Not available If the device does not provide the exposure index: Fluoroscopy Time:  39 seconds Number of Acquired Images:  2 FINDINGS: Left hip replacement is noted in satisfactory position. No acute bony or soft tissue abnormality is noted. IMPRESSION: Left hip replacement without acute abnormality. Electronically Signed   By: Alcide Clever M.D.   On: 08/04/2015 15:16   Dg Hip Port Unilat With Pelvis 1v  Left  Result Date: 08/04/2015 CLINICAL DATA:  Postop left hip replacement. EXAM: DG HIP (WITH OR WITHOUT PELVIS) 1V PORT LEFT COMPARISON:  08/04/2015 and 06/30/2015 FINDINGS: Examination demonstrates evidence of patient's recent left total hip arthroplasty with prosthetic components normally located intact. Right hip arthroplasty intact. Minimal degenerate change of the sacroiliac joints. Mild diffuse decreased bone mineralization. IMPRESSION: Expected changes post left total hip arthroplasty with hardware intact. Electronically Signed   By: Elberta Fortis M.D.   On: 08/04/2015 17:07   Dg Hip Operative Unilat With Pelvis Left  Result Date: 08/04/2015 CLINICAL DATA:  Left hip degenerative change EXAM: OPERATIVE LEFT HIP WITH PELVIS; DG C-ARM 61-120 MIN COMPARISON:  None. FLUOROSCOPY TIME:  Radiation Exposure Index (as provided by the fluoroscopic device): Not available If the device does not provide the exposure index: Fluoroscopy Time:  39 seconds Number of Acquired Images:  2 FINDINGS: Left hip replacement is noted in satisfactory position. No acute bony or soft tissue abnormality is noted. IMPRESSION: Left hip replacement without acute abnormality. Electronically Signed   By: Alcide Clever M.D.   On: 08/04/2015 15:16    Disposition: 01-Home or Self Care  Discharge Instructions    Call MD / Call 911    Complete by:  As directed   If you experience chest pain or shortness of breath, CALL 911 and be transported to the hospital emergency room.  If you develope a fever above 101 F, pus (white drainage) or increased drainage or redness at the wound, or calf pain, call your surgeon's office.   Constipation Prevention    Complete by:  As directed   Drink plenty of fluids.  Prune juice may be helpful.  You may use a stool softener, such as Colace (over the counter) 100 mg twice a day.  Use MiraLax (over the counter) for constipation as needed.   Diet - low sodium heart healthy    Complete by:  As directed    Discharge patient    Complete by:  As directed   Increase activity slowly as tolerated    Complete by:  As directed      Follow-up Information    [facility] .   Why:  Someone from [facility] will contact you to arrange start date and time for therapy. Contact information: [address] [place] [address] [phone]        Kathryne Hitch, MD Follow up in 2 week(s).   Specialty:  Orthopedic Surgery Contact information: [address] [place] [address] [phone]            Signed: Kathryne Hitch 08/06/2015, 11:58 AM

## 2015-08-06 NOTE — Discharge Instructions (Signed)

## 2015-08-06 NOTE — Progress Notes (Signed)
Patient ID: Tara Acosta, female   DOB: 02-Mar-1950, 65 y.o.   MRN: [id number] Much improved and feels better overall.  Can be discharged to home today.

## 2015-08-06 NOTE — Progress Notes (Signed)
**Note De-Identified Tara Obfuscation** Physical Therapy Treatment Patient Details Name: Tara Acosta MRN: [id number] DOB: 10-17-1950 Today's Date: 08/06/2015    History of Present Illness Pt is a 65 y/o female s/p L THA (anterior approach). PMH including but not limited to R THA in 2014.    PT Comments    Pt presented supine in bed with HOB elevated, awake and willing to participate in therapy session. Pt making excellent progress towards achieving her goals. Pt successfully completed stair training this session. Pt would continue to benefit from skilled physical therapy services at this time while admitted and after d/c to address her limitations in order to improve her overall safety and independence with functional mobility.    Follow Up Recommendations  Home health PT;Supervision/Assistance - 24 hour     Equipment Recommendations  None recommended by PT;Other (comment) (pt stated having all necessary equipment at home)    Recommendations for Other Services       Precautions / Restrictions Precautions Precautions: Fall Restrictions Weight Bearing Restrictions: Yes LLE Weight Bearing: Weight bearing as tolerated    Mobility  Bed Mobility Overal bed mobility: Needs Assistance Bed Mobility: Supine to Sit     Supine to sit: Supervision;HOB elevated     General bed mobility comments: pt required increased time  Transfers Overall transfer level: Needs assistance Equipment used: Rolling walker (2 wheeled) Transfers: Sit to/from Stand Sit to Stand: Min guard         General transfer comment: pt required increased time  Ambulation/Gait Ambulation/Gait assistance: Min guard Ambulation Distance (Feet): 40 Feet (40 ft x2 with stair training in between) Assistive device: Rolling walker (2 wheeled) Gait Pattern/deviations: Step-to pattern;Decreased step length - right;Decreased stance time - left;Decreased weight shift to left Gait velocity: decreased Gait velocity interpretation: Below normal speed for  age/gender     Stairs Stairs: Yes Stairs assistance: Min guard Stair Management: No rails;Backwards;With walker Number of Stairs: 1 General stair comments: PT demonstrated technique and pt able to perform successfully with husband present  Wheelchair Mobility    Modified Rankin (Stroke Patients Only)       Balance Overall balance assessment: Needs assistance Sitting-balance support: Feet supported;No upper extremity supported Sitting balance-Leahy Scale: Fair     Standing balance support: Single extremity supported;During functional activity Standing balance-Leahy Scale: Poor                      Cognition Arousal/Alertness: Awake/alert Behavior During Therapy: WFL for tasks assessed/performed Overall Cognitive Status: Within Functional Limits for tasks assessed                      Exercises      General Comments        Pertinent Vitals/Pain Pain Assessment: No/denies pain Pain Intervention(s): Monitored during session    Home Living                      Prior Function            PT Goals (current goals can now be found in the care plan section) Acute Rehab PT Goals Patient Stated Goal: return home PT Goal Formulation: With patient/family Time For Goal Achievement: 08/12/15 Potential to Achieve Goals: Good Progress towards PT goals: Progressing toward goals    Frequency  7X/week    PT Plan Current plan remains appropriate    Co-evaluation             End of Session Equipment Utilized  During Treatment: Gait belt Activity Tolerance: Patient limited by fatigue Patient left: in chair;with call bell/phone within reach;with family/visitor present     Time: 6045-4098 PT Time Calculation (min) (ACUTE ONLY): 18 min  Charges:  $Gait Training: 8-22 mins                    G CodesAlessandra Bevels [name] 08/09/2015, 10:44 AM [name], PT, DPT [phone]

## 2015-08-06 NOTE — Care Management Important Message (Signed)
Important Message  Patient Details  Name: Tara Acosta MRN: [id number] Date of Birth: 04/26/1950   Medicare Important Message Given:  Yes    Bernadette Hoit 08/06/2015, 10:54 AM

## 2015-08-06 NOTE — Progress Notes (Signed)
Pt stable for d/c home today per MD. Pt's nausea improved today, and pain controlled on PO medications. She met all PT goals, has needed equipment at home. Discharge instructions and prescriptions reviewed with pt and her husband, all questions answered. Pt assisted to car by volunteer, and belongings sent with her husband.   [place], Jerry Caras

## 2015-10-14 ENCOUNTER — Ambulatory Visit (INDEPENDENT_AMBULATORY_CARE_PROVIDER_SITE_OTHER): Payer: Medicare Other | Admitting: Orthopaedic Surgery

## 2015-10-14 DIAGNOSIS — M1612 Unilateral primary osteoarthritis, left hip: Secondary | ICD-10-CM

## 2015-10-14 DIAGNOSIS — M25552 Pain in left hip: Secondary | ICD-10-CM

## 2016-02-02 ENCOUNTER — Other Ambulatory Visit (INDEPENDENT_AMBULATORY_CARE_PROVIDER_SITE_OTHER): Payer: Self-pay

## 2016-02-02 ENCOUNTER — Telehealth (INDEPENDENT_AMBULATORY_CARE_PROVIDER_SITE_OTHER): Payer: Self-pay | Admitting: Orthopaedic Surgery

## 2016-02-02 MED ORDER — AMOXICILLIN 500 MG PO TABS: ORAL_TABLET | ORAL | 1 refills | Status: DC

## 2016-02-02 NOTE — Telephone Encounter (Signed)
Pt requesting antibiotic for a dental appt  [phone]

## 2016-02-02 NOTE — Telephone Encounter (Signed)
I told patient she didn't need this, but she requested to have this anyway

## 2016-04-13 ENCOUNTER — Ambulatory Visit (INDEPENDENT_AMBULATORY_CARE_PROVIDER_SITE_OTHER): Payer: Medicare Other

## 2016-04-13 ENCOUNTER — Ambulatory Visit (INDEPENDENT_AMBULATORY_CARE_PROVIDER_SITE_OTHER): Payer: Medicare Other | Admitting: Orthopaedic Surgery

## 2016-04-13 DIAGNOSIS — Z96642 Presence of left artificial hip joint: Secondary | ICD-10-CM | POA: Diagnosis not present

## 2016-04-13 DIAGNOSIS — G8929 Other chronic pain: Secondary | ICD-10-CM | POA: Diagnosis not present

## 2016-04-13 DIAGNOSIS — M25511 Pain in right shoulder: Secondary | ICD-10-CM | POA: Diagnosis not present

## 2016-04-13 MED ORDER — METHYLPREDNISOLONE ACETATE 40 MG/ML IJ SUSP
40.0000 mg | INTRAMUSCULAR | Status: AC | PRN
  Administered 2016-04-13: 40 mg via INTRA_ARTICULAR

## 2016-04-13 NOTE — Progress Notes (Signed)
Office Visit Note   Patient: Tara Acosta           Date of Birth: [birth year]           MRN: [id number] Visit Date: 04/13/2016              Requested by: Gordy Savers, MD [address] [place], [place] [address] PCP: Rogelia Boga, MD   Assessment & Plan: Visit Diagnoses:  1. History of total left hip replacement   2. Chronic right shoulder pain     Plan: She did request for steroid injection her right shoulder The risks and benefits of this to hurt and placed injection without difficulty. I do think that she needs to slow down with some overexercising her she may be overdoing it at age 66 but overall she is very active in seems to be functioning well. We'll see her back in 6 months to see how she is doing overall. No x-rays needed at that visit  Follow-Up Instructions: Return in about 6 months (around 10/13/2016).   Orders:  Orders Placed This Encounter  Procedures  . Large Joint Injection/Arthrocentesis  . XR Pelvis 1-2 Views   No orders of the defined types were placed in this encounter.     Procedures: Large Joint Inj Date/Time: 04/13/2016 11:28 AM Performed by: Kathryne Hitch Authorized by: Kathryne Hitch   Location:  Shoulder Site:  R subacromial bursa Ultrasound Guidance: No   Fluoroscopic Guidance: No   Arthrogram: No   Medications:  40 mg methylPREDNISolone acetate 40 MG/ML     Clinical Data: No additional findings.   Subjective: No chief complaint on file. The patient is now 8 months status post a left total hip arthroplasty and a 4 year status post a right total hip arthroplasty both done through direct injury approach. We have also been following her for her right shoulder which is been painful and weak with overhead activities. She like to have this shoulder injection again today. She last had one of for 6 months ago. She is very active individual at 16 is going to the gym about 4 times a week and doing a  lot of exercises. She's been having a little bit of pain in both her hips but not severe. She does report weakness in her right shoulder with overhead activities and he does hurling on it at night. He denies any leg length discrepancy. She denies a nubs and tingling in her hips. She denies any groin pain.  HPI  Review of Systems Denies any chest pain, shortness of breath, fever, chills, nausea, vomiting, headache  Objective: Vital Signs: There were no vitals taken for this visit.  Physical Exam Alert and oriented 3 and in no acute distress. She walks with no limp and no assistive device. Ortho Exam Examination of both hips show fluid range of motion. There is a little bit of pain over the IT band and trochanteric area on both hips but this is minimal. She is neurovascular intact to she's got excellent strength in both hips and both knees. Examination of her right shoulder does show some pain with stressing the rotator cuff with abduction and external rotation. She definitely has some deficits of the rotator cuff as well. There is positive signs of impingement of the right shoulder. Specialty Comments:  No specialty comments available.  Imaging: Xr Pelvis 1-2 Views  Result Date: 04/13/2016 AP pelvis shows well seated implants on both her right and left total hip  replacements with no, getting features and no evidence of loosening.    PMFS History: Patient Active Problem List   Diagnosis Date Noted  . History of total left hip replacement 04/13/2016  . Chronic right shoulder pain 04/13/2016  . Osteoarthritis of left hip 08/04/2015  . Status post left hip replacement 08/04/2015  . Degenerative arthritis of hip 06/26/2012  . History of melanoma 10/03/2011  . GERD (gastroesophageal reflux disease) 09/29/2010  . 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 OSTEOARTHRITIS 02/06/2007  . NEPHROLITHIASIS, HX OF 02/06/2007   Past Medical History:  Diagnosis Date  . Family history of anesthesia  complication    family /w N&V  . GERD (gastroesophageal reflux disease)   . NEPHROLITHIASIS, HX OF 02/06/2007   cystoscopy- with anesth.   . OSTEOARTHRITIS 02/06/2007  . 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 Gout Father   . Hypertension Father   . Dementia Father   . Hypertension Mother   . Coronary artery disease Mother   . Pancreatic cancer      Uncle    Past Surgical History:  Procedure Laterality Date  . COLONOSCOPY  2009  . TOTAL HIP ARTHROPLASTY Right 06/26/2012   Procedure: RIGHT TOTAL HIP ARTHROPLASTY ANTERIOR APPROACH;  Surgeon: Kathryne Hitch, MD;  Location: [facility] OR;  Service: Orthopedics;  Laterality: Right;  . TOTAL HIP ARTHROPLASTY Left 08/04/2015  . TOTAL HIP ARTHROPLASTY Left 08/04/2015   Procedure: LEFT TOTAL HIP ARTHROPLASTY ANTERIOR APPROACH;  Surgeon: Kathryne Hitch, MD;  Location: [facility] OR;  Service: Orthopedics;  Laterality: Left;  . TUBAL LIGATION     Social History   Occupational History  . retired [employer]   Social History Main Topics  . Smoking status: Never Smoker  . Smokeless tobacco: Never Used  . Alcohol use Yes     Comment: social  . Drug use: No  . Sexual activity: Not on file

## 2016-04-26 ENCOUNTER — Ambulatory Visit (INDEPENDENT_AMBULATORY_CARE_PROVIDER_SITE_OTHER): Payer: Medicare Other | Admitting: Internal Medicine

## 2016-04-26 ENCOUNTER — Encounter: Payer: Self-pay | Admitting: Internal Medicine

## 2016-04-26 VITALS — BP 122/60 | HR 88 | Temp 98.2°F | Ht 68.0 in | Wt 163.6 lb

## 2016-04-26 DIAGNOSIS — M159 Polyosteoarthritis, unspecified: Secondary | ICD-10-CM

## 2016-04-26 DIAGNOSIS — Z87442 Personal history of urinary calculi: Secondary | ICD-10-CM

## 2016-04-26 DIAGNOSIS — M150 Primary generalized (osteo)arthritis: Secondary | ICD-10-CM

## 2016-04-26 DIAGNOSIS — Z0000 Encounter for general adult medical examination without abnormal findings: Secondary | ICD-10-CM | POA: Diagnosis not present

## 2016-04-26 DIAGNOSIS — E785 Hyperlipidemia, unspecified: Secondary | ICD-10-CM

## 2016-04-26 LAB — COMPREHENSIVE METABOLIC PANEL
ALT: 12 U/L (ref 0–35)
AST: 16 U/L (ref 0–37)
Albumin: 4.3 g/dL (ref 3.5–5.2)
Alkaline Phosphatase: 65 U/L (ref 39–117)
BUN: 13 mg/dL (ref 6–23)
CALCIUM: 9.8 mg/dL (ref 8.4–10.5)
CHLORIDE: 103 meq/L (ref 96–112)
CO2: 30 meq/L (ref 19–32)
CREATININE: 0.8 mg/dL (ref 0.40–1.20)
GFR: 75.82 mL/min (ref >60.00)
Glucose, Bld: 90 mg/dL (ref 70–99)
POTASSIUM: 4 meq/L (ref 3.5–5.1)
Sodium: 140 mEq/L (ref 135–145)
Total Bilirubin: 0.4 mg/dL (ref 0.2–1.2)
Total Protein: 6.7 g/dL (ref 6.0–8.3)

## 2016-04-26 LAB — CBC WITH DIFFERENTIAL/PLATELET
BASOS PCT: 0.7 % (ref 0.0–3.0)
Basophils Absolute: 0 10*3/uL (ref 0.0–0.1)
EOS PCT: 1.2 % (ref 0.0–5.0)
Eosinophils Absolute: 0.1 10*3/uL (ref 0.0–0.7)
HCT: 42.3 % (ref 36.0–46.0)
HEMOGLOBIN: 14.2 g/dL (ref 12.0–15.0)
Lymphocytes Relative: 32.3 % (ref 12.0–46.0)
Lymphs Abs: 1.8 10*3/uL (ref 0.7–4.0)
MCHC: 33.6 g/dL (ref 30.0–36.0)
MCV: 92.9 fl (ref 78.0–100.0)
MONOS PCT: 7.6 % (ref 3.0–12.0)
Monocytes Absolute: 0.4 10*3/uL (ref 0.1–1.0)
NEUTROS ABS: 3.3 10*3/uL (ref 1.4–7.7)
Neutrophils Relative %: 58.2 % (ref 43.0–77.0)
PLATELETS: 242 10*3/uL (ref 150.0–400.0)
RBC: 4.55 Mil/uL (ref 3.87–5.11)
RDW: 13.1 % (ref 11.5–15.5)
WBC: 5.6 10*3/uL (ref 4.0–10.5)

## 2016-04-26 LAB — LIPID PANEL
CHOL/HDL RATIO: 4
Cholesterol: 242 mg/dL — ABNORMAL HIGH (ref 0–200)
HDL: 69.1 mg/dL (ref >39.00)
LDL CALC: 156 mg/dL — AB (ref 0–99)
NonHDL: 173.26
TRIGLYCERIDES: 88 mg/dL (ref 0.0–149.0)
VLDL: 17.6 mg/dL (ref 0.0–40.0)

## 2016-04-26 LAB — TSH: TSH: 2.87 u[IU]/mL (ref 0.35–4.50)

## 2016-04-26 NOTE — Progress Notes (Signed)
Pre visit review using our clinic review tool, if applicable. No additional management support is needed unless otherwise documented below in the visit note. 

## 2016-04-26 NOTE — Patient Instructions (Addendum)
WE NOW OFFER   [facility] FAST TRACK!!!  SAME DAY Appointments for ACUTE CARE  Such as: Sprains, Injuries, cuts, abrasions, rashes, muscle pain, joint pain, back pain Colds, flu, sore throats, headache, allergies, cough, fever  Ear pain, sinus and eye infections Abdominal pain, nausea, vomiting, diarrhea, upset stomach Animal/insect bites  3 Easy Ways to Schedule: Walk-In Scheduling Call in scheduling Mychart Sign-up: [email or web address]   Take a calcium supplement, plus [phone] units of vitamin D    It is important that you exercise regularly, at least 20 minutes 3 to 4 times per week.  If you develop chest pain or shortness of breath seek  medical attention.  Return in one year for follow-up        Health Maintenance for Postmenopausal Women Menopause is a normal process in which your reproductive ability comes to an end. This process happens gradually over a span of months to years, usually between the ages of 38 and 8. Menopause is complete when you have missed 12 consecutive menstrual periods. It is important to talk with your health care provider about some of the most common conditions that affect postmenopausal women, such as heart disease, cancer, and bone loss (osteoporosis). Adopting a healthy lifestyle and getting preventive care can help to promote your health and wellness. Those actions can also lower your chances of developing some of these common conditions. What should I know about menopause? During menopause, you may experience a number of symptoms, such as:  Moderate-to-severe hot flashes.  Night sweats.  Decrease in sex drive.  Mood swings.  Headaches.  Tiredness.  Irritability.  Memory problems.  Insomnia. Choosing to treat or not to treat menopausal changes is an individual decision that you make with your health care provider. What should I know about hormone replacement therapy and supplements? Hormone therapy  products are effective for treating symptoms that are associated with menopause, such as hot flashes and night sweats. Hormone replacement carries certain risks, especially as you become older. If you are thinking about using estrogen or estrogen with progestin treatments, discuss the benefits and risks with your health care provider. What should I know about heart disease and stroke? Heart disease, heart attack, and stroke become more likely as you age. This may be due, in part, to the hormonal changes that your body experiences during menopause. These can affect how your body processes dietary fats, triglycerides, and cholesterol. Heart attack and stroke are both medical emergencies. There are many things that you can do to help prevent heart disease and stroke:  Have your blood pressure checked at least every 1-2 years. High blood pressure causes heart disease and increases the risk of stroke.  If you are 37-39 years old, ask your health care provider if you should take aspirin to prevent a heart attack or a stroke.  Do not use any tobacco products, including cigarettes, chewing tobacco, or electronic cigarettes. If you need help quitting, ask your health care provider.  It is important to eat a healthy diet and maintain a healthy weight.  Be sure to include plenty of vegetables, fruits, low-fat dairy products, and lean protein.  Avoid eating foods that are high in solid fats, added sugars, or salt (sodium).  Get regular exercise. This is one of the most important things that you can do for your health.  Try to exercise for at least 150 minutes each week. The type of exercise that you do should increase your heart rate and make you sweat. This is  known as moderate-intensity exercise.  Try to do strengthening exercises at least twice each week. Do these in addition to the moderate-intensity exercise.  Know your numbers.Ask your health care provider to check your cholesterol and your blood  glucose. Continue to have your blood tested as directed by your health care provider. What should I know about cancer screening? There are several types of cancer. Take the following steps to reduce your risk and to catch any cancer development as early as possible. Breast Cancer  Practice breast self-awareness.  This means understanding how your breasts normally appear and feel.  It also means doing regular breast self-exams. Let your health care provider know about any changes, no matter how small.  If you are 50 or older, have a clinician do a breast exam (clinical breast exam or CBE) every year. Depending on your age, family history, and medical history, it may be recommended that you also have a yearly breast X-ray (mammogram).  If you have a family history of breast cancer, talk with your health care provider about genetic screening.  If you are at high risk for breast cancer, talk with your health care provider about having an MRI and a mammogram every year.  Breast cancer (BRCA) gene test is recommended for women who have family members with BRCA-related cancers. Results of the assessment will determine the need for genetic counseling and BRCA1 and for BRCA2 testing. BRCA-related cancers include these types:  Breast. This occurs in males or females.  Ovarian.  Tubal. This may also be called fallopian tube cancer.  Cancer of the abdominal or pelvic lining (peritoneal cancer).  Prostate.  Pancreatic. Cervical, Uterine, and Ovarian Cancer  Your health care provider may recommend that you be screened regularly for cancer of the pelvic organs. These include your ovaries, uterus, and vagina. This screening involves a pelvic exam, which includes checking for microscopic changes to the surface of your cervix (Pap test).  For women ages 21-65, health care providers may recommend a pelvic exam and a Pap test every three years. For women ages 65-65, they may recommend the Pap test and  pelvic exam, combined with testing for human papilloma virus (HPV), every five years. Some types of HPV increase your risk of cervical cancer. Testing for HPV may also be done on women of any age who have unclear Pap test results.  Other health care providers may not recommend any screening for nonpregnant women who are considered low risk for pelvic cancer and have no symptoms. Ask your health care provider if a screening pelvic exam is right for you.  If you have had past treatment for cervical cancer or a condition that could lead to cancer, you need Pap tests and screening for cancer for at least 20 years after your treatment. If Pap tests have been discontinued for you, your risk factors (such as having a new sexual partner) need to be reassessed to determine if you should start having screenings again. Some women have medical problems that increase the chance of getting cervical cancer. In these cases, your health care provider may recommend that you have screening and Pap tests more often.  If you have a family history of uterine cancer or ovarian cancer, talk with your health care provider about genetic screening.  If you have vaginal bleeding after reaching menopause, tell your health care provider.  There are currently no reliable tests available to screen for ovarian cancer. Lung Cancer  Lung cancer screening is recommended for adults 55-80  years old who are at high risk for lung cancer because of a history of smoking. A yearly low-dose CT scan of the lungs is recommended if you:  Currently smoke.  Have a history of at least 30 pack-years of smoking and you currently smoke or have quit within the past 15 years. A pack-year is smoking an average of one pack of cigarettes per day for one year. Yearly screening should:  Continue until it has been 15 years since you quit.  Stop if you develop a health problem that would prevent you from having lung cancer treatment. Colorectal  Cancer  This type of cancer can be detected and can often be prevented.  Routine colorectal cancer screening usually begins at age 57 and continues through age 1.  If you have risk factors for colon cancer, your health care provider may recommend that you be screened at an earlier age.  If you have a family history of colorectal cancer, talk with your health care provider about genetic screening.  Your health care provider may also recommend using home test kits to check for hidden blood in your stool.  A small camera at the end of a tube can be used to examine your colon directly (sigmoidoscopy or colonoscopy). This is done to check for the earliest forms of colorectal cancer.  Direct examination of the colon should be repeated every 5-10 years until age 69. However, if early forms of precancerous polyps or small growths are found or if you have a family history or genetic risk for colorectal cancer, you may need to be screened more often. Skin Cancer  Check your skin from head to toe regularly.  Monitor any moles. Be sure to tell your health care provider:  About any new moles or changes in moles, especially if there is a change in a mole's shape or color.  If you have a mole that is larger than the size of a pencil eraser.  If any of your family members has a history of skin cancer, especially at a young age, talk with your health care provider about genetic screening.  Always use sunscreen. Apply sunscreen liberally and repeatedly throughout the day.  Whenever you are outside, protect yourself by wearing long sleeves, pants, a wide-brimmed hat, and sunglasses. What should I know about osteoporosis? Osteoporosis is a condition in which bone destruction happens more quickly than new bone creation. After menopause, you may be at an increased risk for osteoporosis. To help prevent osteoporosis or the bone fractures that can happen because of osteoporosis, the following is  recommended:  If you are 65-15 years old, get at least 1,000 mg of calcium and at least 600 mg of vitamin D per day.  If you are older than age 59 but younger than age 38, get at least 1,200 mg of calcium and at least 600 mg of vitamin D per day.  If you are older than age 46, get at least 1,200 mg of calcium and at least 800 mg of vitamin D per day. Smoking and excessive alcohol intake increase the risk of osteoporosis. Eat foods that are rich in calcium and vitamin D, and do weight-bearing exercises several times each week as directed by your health care provider. What should I know about how menopause affects my mental health? Depression may occur at any age, but it is more common as you become older. Common symptoms of depression include:  Low or sad mood.  Changes in sleep patterns.  Changes in  appetite or eating patterns.  Feeling an overall lack of motivation or enjoyment of activities that you previously enjoyed.  Frequent crying spells. Talk with your health care provider if you think that you are experiencing depression. What should I know about immunizations? It is important that you get and maintain your immunizations. These include:  Tetanus, diphtheria, and pertussis (Tdap) booster vaccine.  Influenza every year before the flu season begins.  Pneumonia vaccine.  Shingles vaccine. Your health care provider may also recommend other immunizations. This information is not intended to replace advice given to you by your health care provider. Make sure you discuss any questions you have with your health care provider. Document Released: 02/11/2005 Document Revised: 07/10/2015 Document Reviewed: 09/23/2014 Elsevier Interactive Patient Education  2017 [employer].

## 2016-04-26 NOTE — Progress Notes (Signed)
Subjective:    Patient ID: Tara Acosta, female    DOB: 1950-02-07, 66 y.o.   MRN: [id number]  HPI   66 year old patient who is seen today for a annual preventive health examination and subsequent annual Medicare wellness visit.  She enjoys excellent health except for significant arthritis and orthopedic history.  She has had some GERD symptoms which  are controlled with diet and when necessary OTC H2 blockers.  She is status post bilateral  hip surgery for advanced arthritis.  She does have a history of nephrolithiasis. She presently takes no chronic medications.  Past medical history is unremarkable except for a resection for a melanoma involving the right lower arm.  She is followed by dermatology annually Mammogram 12 months ago  She's had a tubal ligation in the past otherwise no surgeries no pregnancies she's had one hospitalization that she recalls her symptomatic renal colic  Family history father died age 32 history of hypertension and gout Mother age 35  she has osteoarthritis and hypertension 2 brothers one died of a gunshot wound  Past Medical History:  Diagnosis Date  . Family history of anesthesia complication    family /w N&V  . GERD (gastroesophageal reflux disease)   . NEPHROLITHIASIS, HX OF 02/06/2007   cystoscopy- with anesth.   . OSTEOARTHRITIS 02/06/2007  . 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 Marital status: Married    Spouse name: N/A  . Number of children: N/A  . Years of education: N/A   Occupational History  . retired [employer]   Social History Main Topics  . Smoking status: Never Smoker  . Smokeless tobacco: Never Used  . Alcohol use Yes     Comment: social  . Drug use: No  . Sexual activity: Not on file   Other Topics Concern  . Not on file   Social History Narrative   Married   No children    Past Surgical History:  Procedure Laterality Date  . COLONOSCOPY  2009  . TOTAL HIP ARTHROPLASTY  Right 06/26/2012   Procedure: RIGHT TOTAL HIP ARTHROPLASTY ANTERIOR APPROACH;  Surgeon: Kathryne Hitch, MD;  Location: [facility] OR;  Service: Orthopedics;  Laterality: Right;  . TOTAL HIP ARTHROPLASTY Left 08/04/2015  . TOTAL HIP ARTHROPLASTY Left 08/04/2015   Procedure: LEFT TOTAL HIP ARTHROPLASTY ANTERIOR APPROACH;  Surgeon: Kathryne Hitch, MD;  Location: [facility] OR;  Service: Orthopedics;  Laterality: Left;  . TUBAL LIGATION      Family History  Problem Relation Age of Onset  . Gout Father   . Hypertension Father   . Dementia Father   . Hypertension Mother   . Coronary artery disease Mother   . Pancreatic cancer      Uncle    Allergies  Allergen Reactions  . Oxycodone Nausea Only    Current Outpatient Prescriptions on File Prior to Visit  Medication Sig Dispense Refill  . ibuprofen (ADVIL,MOTRIN) 200 MG tablet Take 200-400 mg by mouth every 6 (six) hours as needed for moderate pain.     . Multiple Vitamin (MULTIVITAMIN) tablet Take 1 tablet by mouth daily.    . vitamin C (ASCORBIC ACID) 500 MG tablet Take 500 mg by mouth daily.     No current facility-administered medications on file prior to visit.     BP 122/60 (BP Location: Left Arm, Patient Position: Sitting, Cuff Size: Normal)   Pulse 88   Temp 98.2 F (36.8 C) (Oral)  Ht  (1.727 m)   Wt 163 lb 9.6 oz (74.2 kg)   SpO2 98%   BMI 24.88 kg/m   Medicare wellness visit  1. Risk factors, based on past  M,S,F history.  No significant cardiovascular risk factors  2.  Physical activities:remains fairly active with her orthopedic limitations does pertussis.  Y activities 3 times per week 3.  Depression/mood:no history of major depression or mood disorder 4.  Hearing:no deficits  5.  ADL's:independent  6.  Fall risk:low  7.  Home safety:no problems identified  8.  Height weight, and visual acuity;height and weight stable no change in visual acuity 9.  Counseling:continue active lifestyle and heart  healthy diet  10. Lab orders based on risk factors:laboratory update will be reviewed, including lipid profile  11. Referral :follow-up orthopedics  12. Care plan:continue efforts at aggressive risk factor modification  13. Cognitive assessment: alert and oriented with normal affect no cognitive dysfunction 14. Screening: Patient provided with a written and personalized 5-10 year screening schedule in the AVS.    15. Provider List Update: primary care GI orthopedics and radiologyas well as dermatology    Review of Systems  Constitutional: Negative.   HENT: Negative for congestion, dental problem, hearing loss, rhinorrhea, sinus pressure, sore throat and tinnitus.   Eyes: Negative for pain, discharge and visual disturbance.  Respiratory: Negative for cough and shortness of breath.   Cardiovascular: Negative for chest pain, palpitations and leg swelling.  Gastrointestinal: Negative for abdominal distention, abdominal pain, blood in stool, constipation, diarrhea, nausea and vomiting.  Genitourinary: Negative for difficulty urinating, dysuria, flank pain, frequency, hematuria, pelvic pain, urgency, vaginal bleeding, vaginal discharge and vaginal pain.  Musculoskeletal: Positive for arthralgias. Negative for gait problem and joint swelling.  Skin: Negative for rash.  Neurological: Negative for dizziness, syncope, speech difficulty, weakness, numbness and headaches.  Hematological: Negative for adenopathy.  Psychiatric/Behavioral: Negative for agitation, behavioral problems and dysphoric mood. The patient is not nervous/anxious.        Objective:   Physical Exam  Constitutional: She is oriented to person, place, and time. She appears well-developed and well-nourished.  HENT:  Head: Normocephalic and atraumatic.  Right Ear: External ear normal.  Left Ear: External ear normal.  Mouth/Throat: Oropharynx is clear and moist.  Eyes: Conjunctivae and EOM are normal.  Neck: Normal range of  motion. Neck supple. No JVD present. No thyromegaly present.  Cardiovascular: Normal rate, regular rhythm, normal heart sounds and intact distal pulses.   No murmur heard. Right dorsalis pedis pulse diminished  Pulmonary/Chest: Effort normal and breath sounds normal. She has no wheezes. She has no rales.  Abdominal: Soft. Bowel sounds are normal. She exhibits no distension and no mass. There is no tenderness. There is no rebound and no guarding.  Genitourinary: Vagina normal.  Musculoskeletal: Normal range of motion. She exhibits no edema or tenderness.  Bilateral surgical scars anterior upper thigh region  Neurological: She is alert and oriented to person, place, and time. She has normal reflexes. No cranial nerve deficit. She exhibits normal muscle tone. Coordination normal.  Skin: Skin is warm and dry. No rash noted.  Psychiatric: She has a normal mood and affect. Her behavior is normal.          Assessment & Plan:   Preventive health exam Medicare wellness visit Osteoarthritis.  Status post bilateral total knee replacement surgery History of melanoma.  Continue annual follow-up with dermatology  Review lab Calcium and vitamin D supplements Follow-up colonoscopy 2 years  Nyoka Cowden

## 2016-09-22 ENCOUNTER — Encounter: Payer: Self-pay | Admitting: Internal Medicine

## 2016-10-12 ENCOUNTER — Ambulatory Visit (INDEPENDENT_AMBULATORY_CARE_PROVIDER_SITE_OTHER): Payer: Medicare Other | Admitting: Orthopaedic Surgery

## 2016-10-12 DIAGNOSIS — G8929 Other chronic pain: Secondary | ICD-10-CM | POA: Diagnosis not present

## 2016-10-12 DIAGNOSIS — Z96642 Presence of left artificial hip joint: Secondary | ICD-10-CM | POA: Diagnosis not present

## 2016-10-12 DIAGNOSIS — M25511 Pain in right shoulder: Secondary | ICD-10-CM

## 2016-10-12 NOTE — Progress Notes (Signed)
The patient is following up 6 months after her last visit. She is now 14 months out from a left total hip arthroplasty and over 4 years out from right total hip arthroplasty. She'll hips are doing well. She said the right hip is completely cinematic the left hip still has some pain but she said it took a while to get over all of her pain from the right side. Her bigger complaint is her right shoulder. It hurts mainly overhead activities and reaching across her. We have tried an injection in her subacromial area and she said that didn't really help much. She denies any significant weakness and is not interested in any other intervention with her shoulder. I talked her about potentially an MRI that she is deferring this and said she can just deal with it.  On exam there is only just subtle slight weakness in the rotator cuff. Her internal rotation with adduction is limited and she does have positive Neer and Hawkins signs. The shoulders clinical well located. Her motion is both hips show excellent range of motion with no pain at all. She is not walking with a limp her leg was are equal.  At this point she'll follow-up as needed. I talked about things and would need to bring her back to [place] regarding the hips or her shoulder. Again she's not interested in any other intervention for now. All questions and concerns were answered and addressed.

## 2017-05-16 ENCOUNTER — Encounter: Payer: Self-pay | Admitting: Internal Medicine

## 2017-06-14 ENCOUNTER — Ambulatory Visit (INDEPENDENT_AMBULATORY_CARE_PROVIDER_SITE_OTHER): Payer: Medicare Other | Admitting: Internal Medicine

## 2017-06-14 ENCOUNTER — Encounter: Payer: Self-pay | Admitting: Internal Medicine

## 2017-06-14 VITALS — BP 122/70 | HR 102 | Temp 99.4°F | Wt 163.0 lb

## 2017-06-14 DIAGNOSIS — J069 Acute upper respiratory infection, unspecified: Secondary | ICD-10-CM | POA: Diagnosis not present

## 2017-06-14 DIAGNOSIS — B9789 Other viral agents as the cause of diseases classified elsewhere: Secondary | ICD-10-CM

## 2017-06-14 MED ORDER — HYDROCODONE-HOMATROPINE 5-1.5 MG/5ML PO SYRP: 5.0000 mL | ORAL_SOLUTION | Freq: Four times a day (QID) | ORAL | 0 refills | Status: AC | PRN

## 2017-06-14 NOTE — Patient Instructions (Addendum)

## 2017-06-14 NOTE — Progress Notes (Signed)
Subjective:    Patient ID: Tara Acosta, female    DOB: 10/04/1950, 67 y.o.   MRN: [id number]  HPI  67 year old patient who is seen today with a 7157-month history of fever sore throat cough and general malaise.  She describes some postnasal drip.  Cough is her predominant symptom.  Past Medical History:  Diagnosis Date  . Family history of anesthesia complication    family /w N&V  . GERD (gastroesophageal reflux disease)   . NEPHROLITHIASIS, HX OF 02/06/2007   cystoscopy- with anesth.   . OSTEOARTHRITIS 02/06/2007  . 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 Marital status: Married    Spouse name: Not on file  . Number of children: Not on file  . Years of education: Not on file  . Highest education level: Not on file  Occupational History  . Occupation: retired    Associate Professormployer: [employer]  Social Needs  . Financial resource strain: Not on file  . Food insecurity:    Worry: Not on file    Inability: Not on file  . Transportation needs:    Medical: Not on file    Non-medical: Not on file  Tobacco Use  . Smoking status: Never Smoker  . Smokeless tobacco: Never Used  Substance and Sexual Activity  . Alcohol use: Yes    Comment: social  . Drug use: No  . Sexual activity: Not on file  Lifestyle  . Physical activity:    Days per week: Not on file    Minutes per session: Not on file  . Stress: Not on file  Relationships  . Social connections:    Talks on phone: Not on file    Gets together: Not on file    Attends religious service: Not on file    Active member of club or organization: Not on file    Attends meetings of clubs or organizations: Not on file    Relationship status: Not on file  . Intimate partner violence:    Fear of current or ex partner: Not on file    Emotionally abused: Not on file    Physically abused: Not on file    Forced sexual activity: Not on file  Other Topics Concern  . Not on file  Social History  Narrative   Married   No children    Past Surgical History:  Procedure Laterality Date  . COLONOSCOPY  2009  . TOTAL HIP ARTHROPLASTY Right 06/26/2012   Procedure: RIGHT TOTAL HIP ARTHROPLASTY ANTERIOR APPROACH;  Surgeon: Kathryne Hitchhristopher Y Blackman, MD;  [facility];  Service: Orthopedics;  Laterality: Right;  . TOTAL HIP ARTHROPLASTY Left 08/04/2015  . TOTAL HIP ARTHROPLASTY Left 08/04/2015   Procedure: LEFT TOTAL HIP ARTHROPLASTY ANTERIOR APPROACH;  Surgeon: Kathryne Hitchhristopher Y Blackman, MD;  Location: [facility] OR;  Service: Orthopedics;  Laterality: Left;  . TUBAL LIGATION      Family History  Problem Relation Age of Onset  . Gout Father   . Hypertension Father   . Dementia Father   . Hypertension Mother   . Coronary artery disease Mother   . Pancreatic cancer Unknown        Uncle    Allergies  Allergen Reactions  . Oxycodone Nausea Only    Current Outpatient Medications on File Prior to Visit  Medication Sig Dispense Refill  . ibuprofen (ADVIL,MOTRIN) 200 MG tablet Take 200-400 mg by mouth every 6 (six) hours as needed for moderate  pain.     . Multiple Vitamin (MULTIVITAMIN) tablet Take 1 tablet by mouth daily.    . vitamin C (ASCORBIC ACID) 500 MG tablet Take 500 mg by mouth daily.     No current facility-administered medications on file prior to visit.     BP 122/70 (BP Location: Right Arm, Patient Position: Sitting, Cuff Size: Normal)   Pulse (!) 102   Temp 99.4 F (37.4 C) (Oral)   Wt 163 lb (73.9 kg)   SpO2 96%   BMI 24.78 kg/m     Review of Systems  Constitutional: Positive for activity change, chills, fatigue and fever.  HENT: Positive for congestion, rhinorrhea and sore throat. Negative for dental problem, hearing loss, sinus pressure and tinnitus.   Eyes: Negative for pain, discharge and visual disturbance.  Respiratory: Positive for cough. Negative for shortness of breath.   Cardiovascular: Negative for chest pain, palpitations and leg swelling.    Gastrointestinal: Negative for abdominal distention, abdominal pain, blood in stool, constipation, diarrhea, nausea and vomiting.  Genitourinary: Negative for difficulty urinating, dysuria, flank pain, frequency, hematuria, pelvic pain, urgency, vaginal bleeding, vaginal discharge and vaginal pain.  Musculoskeletal: Negative for arthralgias, gait problem and joint swelling.  Skin: Negative for rash.  Neurological: Negative for dizziness, syncope, speech difficulty, weakness, numbness and headaches.  Hematological: Negative for adenopathy.  Psychiatric/Behavioral: Negative for agitation, behavioral problems and dysphoric mood. The patient is not nervous/anxious.        Objective:   Physical Exam  Constitutional: She is oriented to person, place, and time. She appears well-developed and well-nourished.  Temperature 99.4  HENT:  Head: Normocephalic.  Right Ear: External ear normal.  Left Ear: External ear normal.  Oropharynx slightly erythematous  Eyes: Pupils are equal, round, and reactive to light. Conjunctivae and EOM are normal.  Neck: Normal range of motion. Neck supple. No thyromegaly present.  Cardiovascular: Normal rate, regular rhythm, normal heart sounds and intact distal pulses.  Pulmonary/Chest: Effort normal and breath sounds normal. No stridor. No respiratory distress. She has no wheezes. She has no rales.  Abdominal: Soft. Bowel sounds are normal. She exhibits no mass. There is no tenderness.  Musculoskeletal: Normal range of motion.  Lymphadenopathy:    She has no cervical adenopathy.  Neurological: She is alert and oriented to person, place, and time.  Skin: Skin is warm and dry. No rash noted.  Psychiatric: She has a normal mood and affect. Her behavior is normal.          Assessment & Plan:   Viral URI with cough.  Will treat symptomatically  Gordy Savers

## 2017-06-28 ENCOUNTER — Ambulatory Visit (INDEPENDENT_AMBULATORY_CARE_PROVIDER_SITE_OTHER): Payer: Medicare Other | Admitting: Internal Medicine

## 2017-06-28 ENCOUNTER — Encounter: Payer: Self-pay | Admitting: Internal Medicine

## 2017-06-28 VITALS — BP 120/80 | HR 72 | Temp 98.2°F | Wt 161.0 lb

## 2017-06-28 DIAGNOSIS — Z0000 Encounter for general adult medical examination without abnormal findings: Secondary | ICD-10-CM

## 2017-06-28 DIAGNOSIS — Z85820 Personal history of malignant melanoma of skin: Secondary | ICD-10-CM

## 2017-06-28 DIAGNOSIS — Z23 Encounter for immunization: Secondary | ICD-10-CM | POA: Diagnosis not present

## 2017-06-28 DIAGNOSIS — K219 Gastro-esophageal reflux disease without esophagitis: Secondary | ICD-10-CM | POA: Diagnosis not present

## 2017-06-28 DIAGNOSIS — E785 Hyperlipidemia, unspecified: Secondary | ICD-10-CM | POA: Diagnosis not present

## 2017-06-28 DIAGNOSIS — M160 Bilateral primary osteoarthritis of hip: Secondary | ICD-10-CM | POA: Diagnosis not present

## 2017-06-28 DIAGNOSIS — Z87442 Personal history of urinary calculi: Secondary | ICD-10-CM

## 2017-06-28 LAB — CBC WITH DIFFERENTIAL/PLATELET
BASOS PCT: 0.9 % (ref 0.0–3.0)
Basophils Absolute: 0 10*3/uL (ref 0.0–0.1)
EOS PCT: 2.6 % (ref 0.0–5.0)
Eosinophils Absolute: 0.1 10*3/uL (ref 0.0–0.7)
HCT: 39 % (ref 36.0–46.0)
Hemoglobin: 13 g/dL (ref 12.0–15.0)
LYMPHS ABS: 1.4 10*3/uL (ref 0.7–4.0)
Lymphocytes Relative: 35.6 % (ref 12.0–46.0)
MCHC: 33.4 g/dL (ref 30.0–36.0)
MCV: 93.5 fl (ref 78.0–100.0)
MONO ABS: 0.3 10*3/uL (ref 0.1–1.0)
Monocytes Relative: 8.5 % (ref 3.0–12.0)
NEUTROS PCT: 52.4 % (ref 43.0–77.0)
Neutro Abs: 2 10*3/uL (ref 1.4–7.7)
Platelets: 255 10*3/uL (ref 150.0–400.0)
RBC: 4.17 Mil/uL (ref 3.87–5.11)
RDW: 13.2 % (ref 11.5–15.5)
WBC: 3.9 10*3/uL — ABNORMAL LOW (ref 4.0–10.5)

## 2017-06-28 LAB — LIPID PANEL
Cholesterol: 253 mg/dL — ABNORMAL HIGH (ref 0–200)
HDL: 61 mg/dL (ref >39.00)
LDL Cholesterol: 174 mg/dL — ABNORMAL HIGH (ref 0–99)
NONHDL: 191.53
Total CHOL/HDL Ratio: 4
Triglycerides: 90 mg/dL (ref 0.0–149.0)
VLDL: 18 mg/dL (ref 0.0–40.0)

## 2017-06-28 LAB — COMPREHENSIVE METABOLIC PANEL
ALK PHOS: 56 U/L (ref 39–117)
ALT: 15 U/L (ref 0–35)
AST: 18 U/L (ref 0–37)
Albumin: 4.4 g/dL (ref 3.5–5.2)
BUN: 13 mg/dL (ref 6–23)
CHLORIDE: 104 meq/L (ref 96–112)
CO2: 30 mEq/L (ref 19–32)
Calcium: 9.5 mg/dL (ref 8.4–10.5)
Creatinine, Ser: 0.79 mg/dL (ref 0.40–1.20)
GFR: 76.66 mL/min (ref >60.00)
GLUCOSE: 94 mg/dL (ref 70–99)
Potassium: 4.1 mEq/L (ref 3.5–5.1)
SODIUM: 141 meq/L (ref 135–145)
TOTAL PROTEIN: 6.6 g/dL (ref 6.0–8.3)
Total Bilirubin: 0.5 mg/dL (ref 0.2–1.2)

## 2017-06-28 LAB — TSH: TSH: 2.22 u[IU]/mL (ref 0.35–4.50)

## 2017-06-28 NOTE — Progress Notes (Signed)
Subjective:    Patient ID: Tara Acosta, female    DOB: Nov 06, 1950, 67 y.o.   MRN: [id number]  HPI  67 year old patient who is seen today for a preventive health examination. She enjoys excellent health except for a complicated orthopedic history.  She has had bilateral total hip replacement surgeries and does have some chronic right shoulder and some knee pain.  She has a remote history of nephrolithiasis that has been stable.  She takes no chronic medications. Colonoscopy 2010  Past surgical history remarkable for a remote tubal ligation  Family history Father died at 27 with history of gout and hypertension Mother presently 70 with osteoarthritis and hypertension.  Still lives alone but considerable help with family 2 brothers one deceased secondary to a GSW  Past Medical History:  Diagnosis Date  . Family history of anesthesia complication    family /w N&V  . GERD (gastroesophageal reflux disease)   . NEPHROLITHIASIS, HX OF 02/06/2007   cystoscopy- with anesth.   . OSTEOARTHRITIS 02/06/2007  . 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 Marital status: Married    Spouse name: Not on file  . Number of children: Not on file  . Years of education: Not on file  . Highest education level: Not on file  Occupational History  . Occupation: retired    Associate Professor: [employer]  Social Needs  . Financial resource strain: Not on file  . Food insecurity:    Worry: Not on file    Inability: Not on file  . Transportation needs:    Medical: Not on file    Non-medical: Not on file  Tobacco Use  . Smoking status: Never Smoker  . Smokeless tobacco: Never Used  Substance and Sexual Activity  . Alcohol use: Yes    Comment: social  . Drug use: No  . Sexual activity: Not on file  Lifestyle  . Physical activity:    Days per week: Not on file    Minutes per session: Not on file  . Stress: Not on file  Relationships  . Social connections:    Talks  on phone: Not on file    Gets together: Not on file    Attends religious service: Not on file    Active member of club or organization: Not on file    Attends meetings of clubs or organizations: Not on file    Relationship status: Not on file  . Intimate partner violence:    Fear of current or ex partner: Not on file    Emotionally abused: Not on file    Physically abused: Not on file    Forced sexual activity: Not on file  Other Topics Concern  . Not on file  Social History Narrative   Married   No children    Past Surgical History:  Procedure Laterality Date  . COLONOSCOPY  2009  . TOTAL HIP ARTHROPLASTY Right 06/26/2012   Procedure: RIGHT TOTAL HIP ARTHROPLASTY ANTERIOR APPROACH;  Surgeon: Kathryne Hitch, MD;  Location: [facility] OR;  Service: Orthopedics;  Laterality: Right;  . TOTAL HIP ARTHROPLASTY Left 08/04/2015  . TOTAL HIP ARTHROPLASTY Left 08/04/2015   Procedure: LEFT TOTAL HIP ARTHROPLASTY ANTERIOR APPROACH;  Surgeon: Kathryne Hitch, MD;  Location: [facility] OR;  Service: Orthopedics;  Laterality: Left;  . TUBAL LIGATION      Family History  Problem Relation Age of Onset  . Gout Father   . Hypertension Father   .  Dementia Father   . Hypertension Mother   . Coronary artery disease Mother   . Pancreatic cancer Unknown        Uncle    Allergies  Allergen Reactions  . Oxycodone Nausea Only    Current Outpatient Medications on File Prior to Visit  Medication Sig Dispense Refill  . ibuprofen (ADVIL,MOTRIN) 200 MG tablet Take 200-400 mg by mouth every 6 (six) hours as needed for moderate pain.     . Multiple Vitamin (MULTIVITAMIN) tablet Take 1 tablet by mouth daily.    . vitamin C (ASCORBIC ACID) 500 MG tablet Take 500 mg by mouth daily.     No current facility-administered medications on file prior to visit.     BP 120/80 (BP Location: Right Arm, Patient Position: Sitting, Cuff Size: Normal)   Pulse 72   Temp 98.2 F (36.8 C) (Oral)   Wt 161 lb (73 kg)    SpO2 99%   BMI 24.48 kg/m   Subsequent Medicare wellness visit  1. Risk factors, based on past  M,S,F history.  No cardiovascular risk factors.  Both parents enjoyed longevity  2.  Physical activities[place] [facility] [facility] [place];  mild limitation due to arthritis.  Also cares for a elderly mother  3.  Depression/mood: No history of depression or mood disorder  4.  Hearing: No deficits  5.  ADL's: Independent  6.  Fall risk: Low  7.  Home safety: No problems identified  8.  Height weight, and visual acuity; height and weight stable no change in visual acuity  9.  Counseling: Continue heart healthy diet more regular exercise encouraged  10. Lab orders based on risk factors: Laboratory update will be reviewed  11. Referral : None appropriate at this time 12. Care plan: Continue efforts at aggressive risk factor modification  13. Cognitive assessment: Alert and appropriate normal affect.  No cognitive dysfunction  14. Screening: Patient provided with a written and personalized 5-10 year screening schedule in the AVS.    15. Provider List Update:  Primary care orthopedic and dermatology   Review of Systems  Constitutional: Negative.   HENT: Negative for congestion, dental problem, hearing loss, rhinorrhea, sinus pressure, sore throat and tinnitus.   Eyes: Negative for pain, discharge and visual disturbance.  Respiratory: Negative for cough and shortness of breath.   Cardiovascular: Negative for chest pain, palpitations and leg swelling.  Gastrointestinal: Negative for abdominal distention, abdominal pain, blood in stool, constipation, diarrhea, nausea and vomiting.  Genitourinary: Negative for difficulty urinating, dysuria, flank pain, frequency, hematuria, pelvic pain, urgency, vaginal bleeding, vaginal discharge and vaginal pain.  Musculoskeletal: Negative for arthralgias, gait problem and joint swelling.  Skin: Negative for rash.  Neurological: Negative for  dizziness, syncope, speech difficulty, weakness, numbness and headaches.  Hematological: Negative for adenopathy.  Psychiatric/Behavioral: Negative for agitation, behavioral problems and dysphoric mood. The patient is not nervous/anxious.        Objective:   Physical Exam  Constitutional: She is oriented to person, place, and time. She appears well-developed and well-nourished.  HENT:  Head: Normocephalic and atraumatic.  Right Ear: External ear normal.  Left Ear: External ear normal.  Mouth/Throat: Oropharynx is clear and moist.  Some cerumen in both canals Pharyngeal crowding  Eyes: Conjunctivae and EOM are normal.  Neck: Normal range of motion. Neck supple. No JVD present. No thyromegaly present.  Cardiovascular: Normal rate, regular rhythm and normal heart sounds.  No murmur heard. Absent right dorsalis pedis pulse  Pulmonary/Chest: Effort normal and breath sounds normal. She has no wheezes. She has no rales.  Abdominal: Soft. Bowel sounds are normal. She exhibits no distension and no mass. There is no tenderness. There is no rebound and no guarding.  Genitourinary: Rectal exam shows guaiac negative stool.  Musculoskeletal: Normal range of motion. She exhibits no edema or tenderness.  Neurological: She is alert and oriented to person, place, and time. She has normal reflexes. She displays normal reflexes. No cranial nerve deficit. She exhibits normal muscle tone. Coordination normal.  Skin: Skin is warm and dry. No rash noted.  Psychiatric: She has a normal mood and affect. Her behavior is normal.          Assessment & Plan:   Preventive health examination Subsequent Medicare wellness visit History of renal colic.  Nephrolithiasis stable Osteoarthritis.  Status post bilateral total hip replacement surgery History of melanoma.  Continue annual dermatology follow-up  We will review updated lab Follow-up 1 year or as needed Continue annual mammogram Colonoscopy in 1  year  Gordy Savers

## 2017-06-28 NOTE — Patient Instructions (Signed)
Health Maintenance for Postmenopausal Women Menopause is a normal process in which your reproductive ability comes to an end. This process happens gradually over a span of months to years, usually between the ages of 22 and 9. Menopause is complete when you have missed 12 consecutive menstrual periods. It is important to talk with your health care provider about some of the most common conditions that affect postmenopausal women, such as heart disease, cancer, and bone loss (osteoporosis). Adopting a healthy lifestyle and getting preventive care can help to promote your health and wellness. Those actions can also lower your chances of developing some of these common conditions. What should I know about menopause? During menopause, you may experience a number of symptoms, such as:  Moderate-to-severe hot flashes.  Night sweats.  Decrease in sex drive.  Mood swings.  Headaches.  Tiredness.  Irritability.  Memory problems.  Insomnia.  Choosing to treat or not to treat menopausal changes is an individual decision that you make with your health care provider. What should I know about hormone replacement therapy and supplements? Hormone therapy products are effective for treating symptoms that are associated with menopause, such as hot flashes and night sweats. Hormone replacement carries certain risks, especially as you become older. If you are thinking about using estrogen or estrogen with progestin treatments, discuss the benefits and risks with your health care provider. What should I know about heart disease and stroke? Heart disease, heart attack, and stroke become more likely as you age. This may be due, in part, to the hormonal changes that your body experiences during menopause. These can affect how your body processes dietary fats, triglycerides, and cholesterol. Heart attack and stroke are both medical emergencies. There are many things that you can do to help prevent heart disease  and stroke:  Have your blood pressure checked at least every 1-2 years. High blood pressure causes heart disease and increases the risk of stroke.  If you are 53-22 years old, ask your health care provider if you should take aspirin to prevent a heart attack or a stroke.  Do not use any tobacco products, including cigarettes, chewing tobacco, or electronic cigarettes. If you need help quitting, ask your health care provider.  It is important to eat a healthy diet and maintain a healthy weight. ? Be sure to include plenty of vegetables, fruits, low-fat dairy products, and lean protein. ? Avoid eating foods that are high in solid fats, added sugars, or salt (sodium).  Get regular exercise. This is one of the most important things that you can do for your health. ? Try to exercise for at least 150 minutes each week. The type of exercise that you do should increase your heart rate and make you sweat. This is known as moderate-intensity exercise. ? Try to do strengthening exercises at least twice each week. Do these in addition to the moderate-intensity exercise.  Know your numbers.Ask your health care provider to check your cholesterol and your blood glucose. Continue to have your blood tested as directed by your health care provider.  What should I know about cancer screening? There are several types of cancer. Take the following steps to reduce your risk and to catch any cancer development as early as possible. Breast Cancer  Practice breast self-awareness. ? This means understanding how your breasts normally appear and feel. ? It also means doing regular breast self-exams. Let your health care provider know about any changes, no matter how small.  If you are 40  or older, have a clinician do a breast exam (clinical breast exam or CBE) every year. Depending on your age, family history, and medical history, it may be recommended that you also have a yearly breast X-ray (mammogram).  If you  have a family history of breast cancer, talk with your health care provider about genetic screening.  If you are at high risk for breast cancer, talk with your health care provider about having an MRI and a mammogram every year.  Breast cancer (BRCA) gene test is recommended for women who have family members with BRCA-related cancers. Results of the assessment will determine the need for genetic counseling and BRCA1 and for BRCA2 testing. BRCA-related cancers include these types: ? Breast. This occurs in males or females. ? Ovarian. ? Tubal. This may also be called fallopian tube cancer. ? Cancer of the abdominal or pelvic lining (peritoneal cancer). ? Prostate. ? Pancreatic.  Cervical, Uterine, and Ovarian Cancer Your health care provider may recommend that you be screened regularly for cancer of the pelvic organs. These include your ovaries, uterus, and vagina. This screening involves a pelvic exam, which includes checking for microscopic changes to the surface of your cervix (Pap test).  For women ages 21-65, health care providers may recommend a pelvic exam and a Pap test every three years. For women ages 79-65, they may recommend the Pap test and pelvic exam, combined with testing for human papilloma virus (HPV), every five years. Some types of HPV increase your risk of cervical cancer. Testing for HPV may also be done on women of any age who have unclear Pap test results.  Other health care providers may not recommend any screening for nonpregnant women who are considered low risk for pelvic cancer and have no symptoms. Ask your health care provider if a screening pelvic exam is right for you.  If you have had past treatment for cervical cancer or a condition that could lead to cancer, you need Pap tests and screening for cancer for at least 20 years after your treatment. If Pap tests have been discontinued for you, your risk factors (such as having a new sexual partner) need to be  reassessed to determine if you should start having screenings again. Some women have medical problems that increase the chance of getting cervical cancer. In these cases, your health care provider may recommend that you have screening and Pap tests more often.  If you have a family history of uterine cancer or ovarian cancer, talk with your health care provider about genetic screening.  If you have vaginal bleeding after reaching menopause, tell your health care provider.  There are currently no reliable tests available to screen for ovarian cancer.  Lung Cancer Lung cancer screening is recommended for adults 69-62 years old who are at high risk for lung cancer because of a history of smoking. A yearly low-dose CT scan of the lungs is recommended if you:  Currently smoke.  Have a history of at least 30 pack-years of smoking and you currently smoke or have quit within the past 15 years. A pack-year is smoking an average of one pack of cigarettes per day for one year.  Yearly screening should:  Continue until it has been 15 years since you quit.  Stop if you develop a health problem that would prevent you from having lung cancer treatment.  Colorectal Cancer  This type of cancer can be detected and can often be prevented.  Routine colorectal cancer screening usually begins at  age 42 and continues through age 45.  If you have risk factors for colon cancer, your health care provider may recommend that you be screened at an earlier age.  If you have a family history of colorectal cancer, talk with your health care provider about genetic screening.  Your health care provider may also recommend using home test kits to check for hidden blood in your stool.  A small camera at the end of a tube can be used to examine your colon directly (sigmoidoscopy or colonoscopy). This is done to check for the earliest forms of colorectal cancer.  Direct examination of the colon should be repeated every  5-10 years until age 71. However, if early forms of precancerous polyps or small growths are found or if you have a family history or genetic risk for colorectal cancer, you may need to be screened more often.  Skin Cancer  Check your skin from head to toe regularly.  Monitor any moles. Be sure to tell your health care provider: ? About any new moles or changes in moles, especially if there is a change in a mole's shape or color. ? If you have a mole that is larger than the size of a pencil eraser.  If any of your family members has a history of skin cancer, especially at a young age, talk with your health care provider about genetic screening.  Always use sunscreen. Apply sunscreen liberally and repeatedly throughout the day.  Whenever you are outside, protect yourself by wearing long sleeves, pants, a wide-brimmed hat, and sunglasses.  What should I know about osteoporosis? Osteoporosis is a condition in which bone destruction happens more quickly than new bone creation. After menopause, you may be at an increased risk for osteoporosis. To help prevent osteoporosis or the bone fractures that can happen because of osteoporosis, the following is recommended:  If you are 46-71 years old, get at least 1,000 mg of calcium and at least 600 mg of vitamin D per day.  If you are older than age 55 but younger than age 65, get at least 1,200 mg of calcium and at least 600 mg of vitamin D per day.  If you are older than age 54, get at least 1,200 mg of calcium and at least 800 mg of vitamin D per day.  Smoking and excessive alcohol intake increase the risk of osteoporosis. Eat foods that are rich in calcium and vitamin D, and do weight-bearing exercises several times each week as directed by your health care provider. What should I know about how menopause affects my mental health? Depression may occur at any age, but it is more common as you become older. Common symptoms of depression  include:  Low or sad mood.  Changes in sleep patterns.  Changes in appetite or eating patterns.  Feeling an overall lack of motivation or enjoyment of activities that you previously enjoyed.  Frequent crying spells.  Talk with your health care provider if you think that you are experiencing depression. What should I know about immunizations? It is important that you get and maintain your immunizations. These include:  Tetanus, diphtheria, and pertussis (Tdap) booster vaccine.  Influenza every year before the flu season begins.  Pneumonia vaccine.  Shingles vaccine.  Your health care provider may also recommend other immunizations. This information is not intended to replace advice given to you by your health care provider. Make sure you discuss any questions you have with your health care provider. Document Released: 02/11/2005  Document Revised: 07/10/2015 Document Reviewed: 09/23/2014 [employer]vier Interactive Patient Education  2018 [employer]  

## 2017-06-29 LAB — HEPATITIS C ANTIBODY
Hepatitis C Ab: NONREACTIVE
SIGNAL TO CUT-OFF: 0.01 (ref <1.00)

## 2017-09-14 IMAGING — CR DG HIP (WITH OR WITHOUT PELVIS) 1V PORT*L*
2 series · 2 of 2 positions shown · non-contrast
Comparison: 08/04/2015 and 06/30/2015

CLINICAL DATA: Postop left hip replacement.

EXAM:
DG HIP (WITH OR WITHOUT PELVIS) 1V PORT LEFT

[AP (1 of 2)]
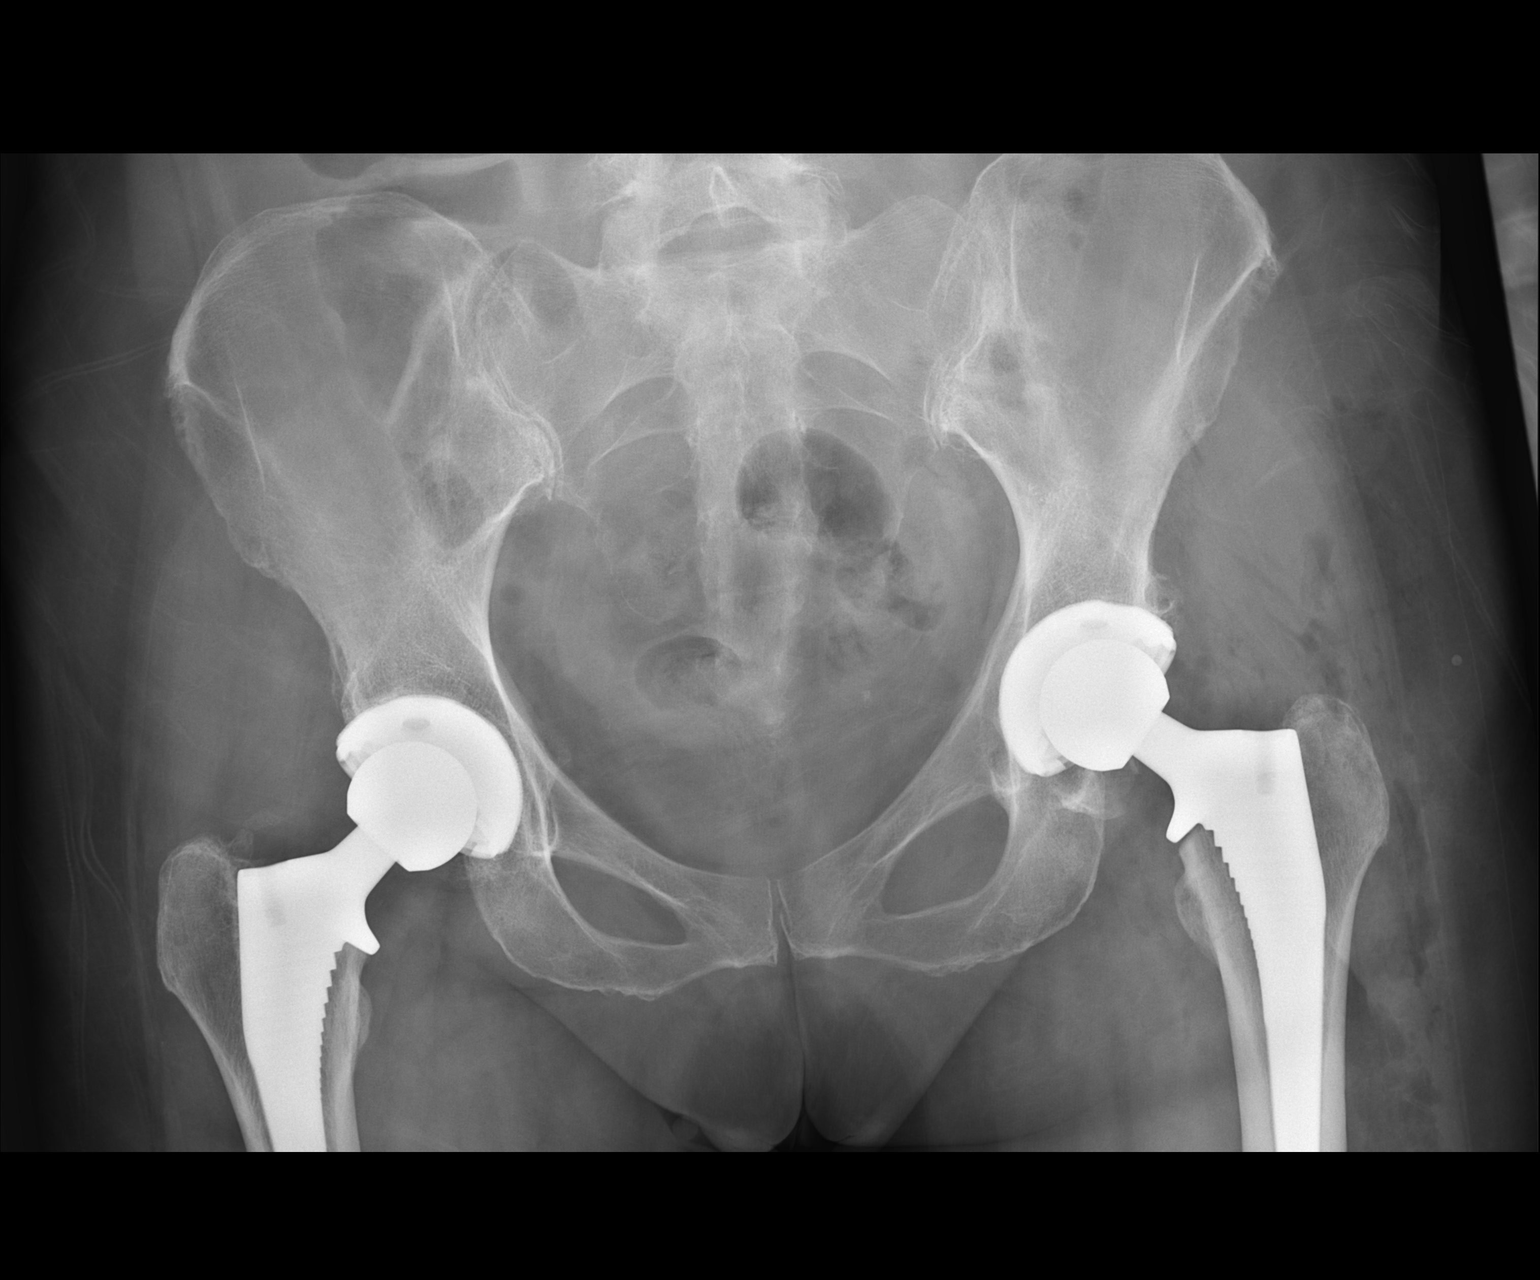

[AP (2 of 2)]
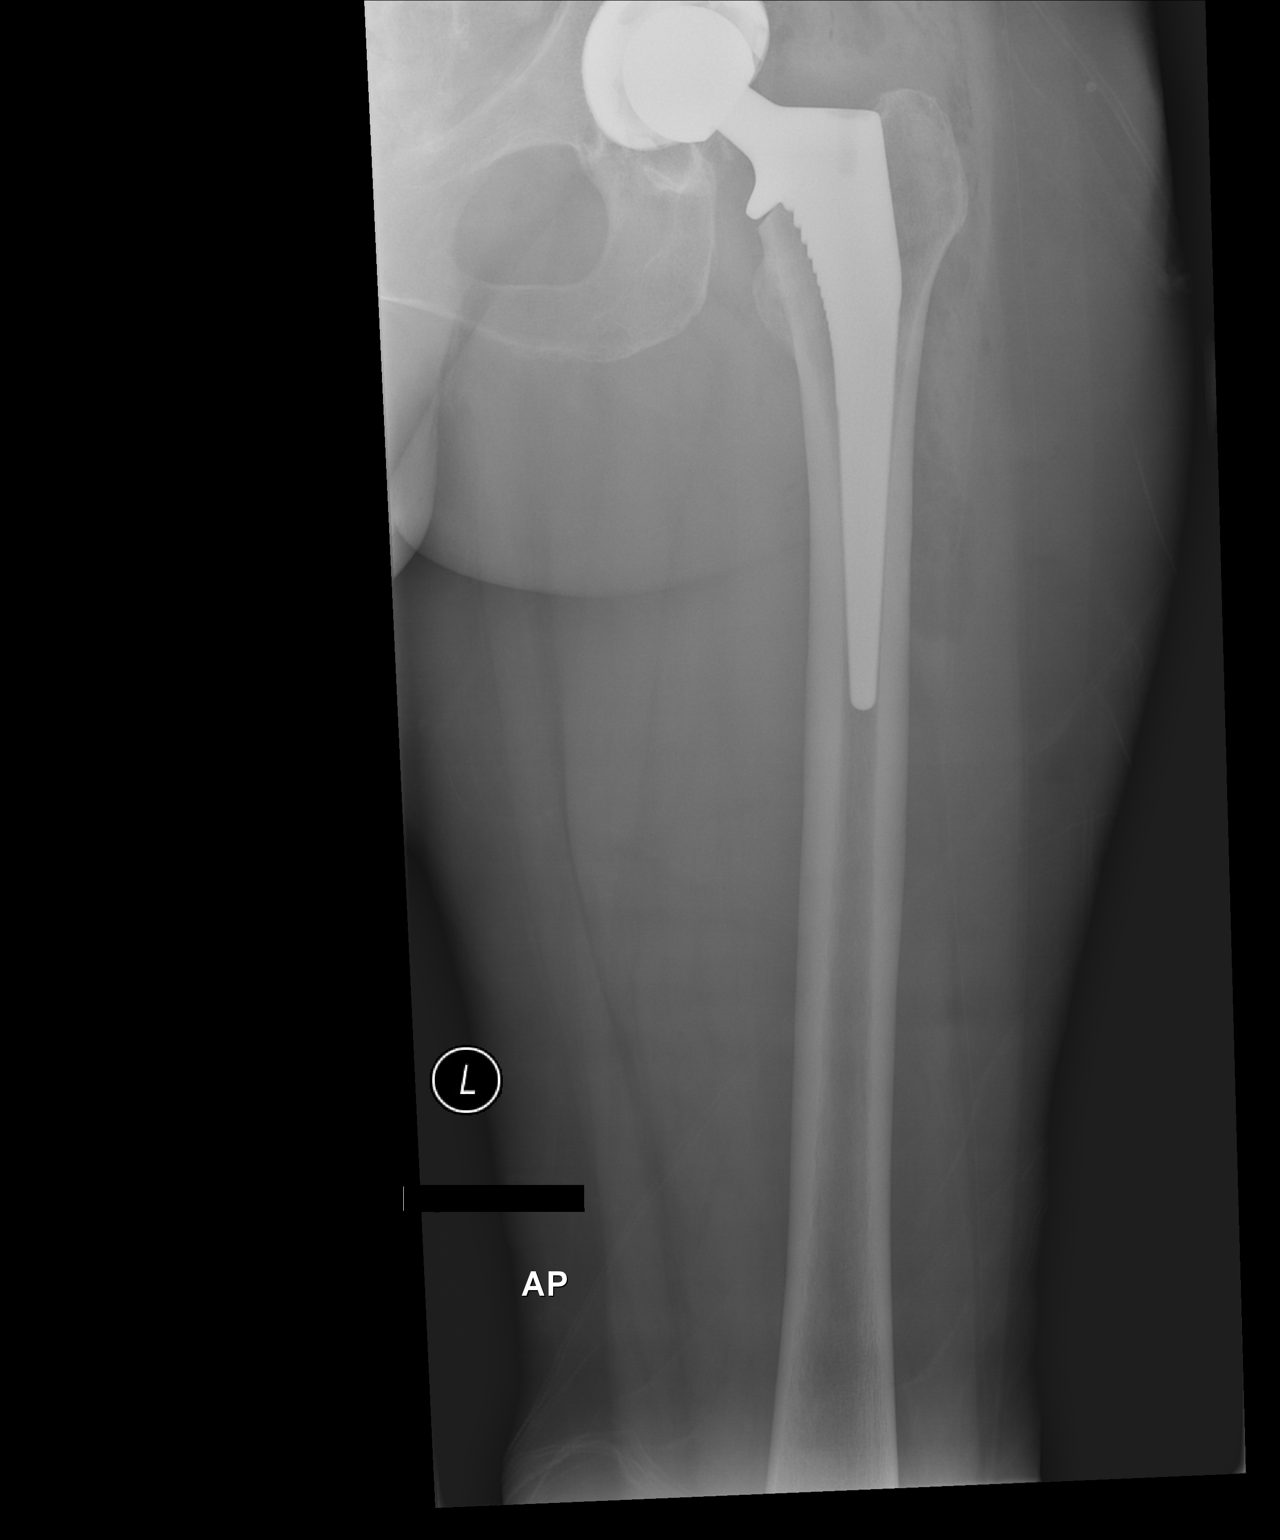

[2 of 2 positions shown; findings below may reference images not displayed]

FINDINGS: Examination demonstrates evidence of patient's recent left total hip
arthroplasty with prosthetic components normally located intact.
Right hip arthroplasty intact. Minimal degenerate change of the
sacroiliac joints. Mild diffuse decreased bone mineralization.
IMPRESSION: Expected changes post left total hip arthroplasty with hardware
intact.

## 2017-09-14 IMAGING — RF DG HIP (WITH PELVIS) OPERATIVE*L*
1 series · 2 of 2 positions shown · non-contrast
Comparison: None.

CLINICAL DATA: Left hip degenerative change

EXAM:
OPERATIVE LEFT HIP WITH PELVIS; DG C-ARM 61-120 MIN

[Series 1: run · 2 of 2 slices shown]
[im 1/2]
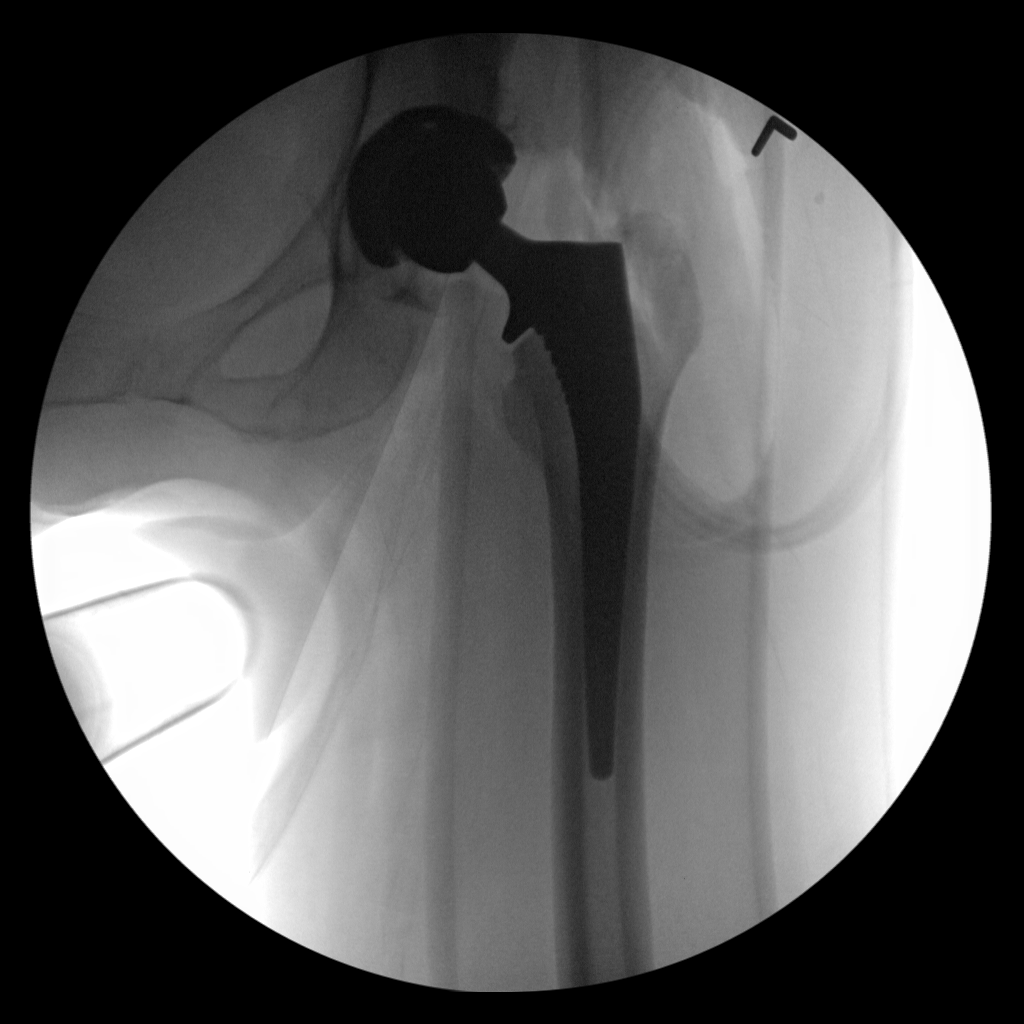
[im 2/2]
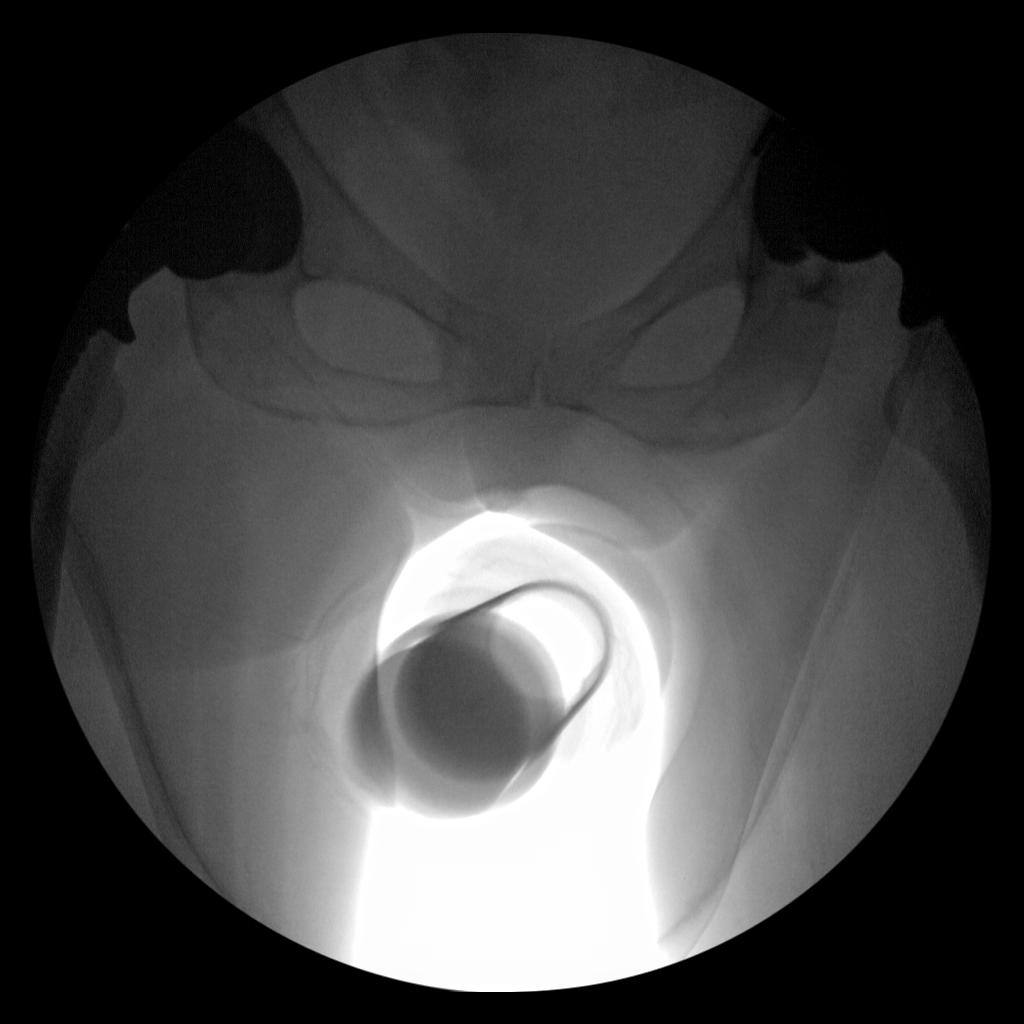

[2 of 2 positions shown; findings below may reference images not displayed]

FLUOROSCOPY TIME:  Radiation Exposure Index (as provided by the
fluoroscopic device): Not available

If the device does not provide the exposure index:

Fluoroscopy Time:  39 seconds

Number of Acquired Images:  2
FINDINGS: Left hip replacement is noted in satisfactory position. No acute
bony or soft tissue abnormality is noted.
IMPRESSION: Left hip replacement without acute abnormality.

## 2019-03-25 ENCOUNTER — Ambulatory Visit: Payer: Medicare Other | Attending: Internal Medicine

## 2019-03-25 ENCOUNTER — Ambulatory Visit: Payer: Medicare Other

## 2019-03-25 DIAGNOSIS — Z23 Encounter for immunization: Secondary | ICD-10-CM

## 2019-03-25 NOTE — Progress Notes (Signed)
   Covid-19 Vaccination Clinic  Name:  Tara Acosta    MRN: [id number] DOB: 08-16-1950  03/25/2019  [name] was observed post Covid-19 immunization for 15 minutes without incident. She was provided with Vaccine Information Sheet and instruction to access the V-Safe system.   [name] was instructed to call 911 with any severe reactions post vaccine: Marland Kitchen Difficulty breathing  . Swelling of face and throat  . A fast heartbeat  . A bad rash all over body  . Dizziness and weakness   Immunizations Administered    Name Date Dose VIS Date Route   Pfizer COVID-19 Vaccine 03/25/2019  9:46 AM 0.3 mL 12/14/2018 Intramuscular   Manufacturer: [employer], [employer]   Lot: [id number]   NDC: [address]-6

## 2019-04-15 ENCOUNTER — Ambulatory Visit: Payer: Medicare Other | Attending: Internal Medicine

## 2019-04-15 DIAGNOSIS — Z23 Encounter for immunization: Secondary | ICD-10-CM

## 2019-04-15 NOTE — Progress Notes (Signed)
   Covid-19 Vaccination Clinic  Name:  Tara Acosta    MRN: [id number] DOB: 29-Aug-1950  04/15/2019  Tara Acosta was observed post Covid-19 immunization for 15 minutes without incident. She was provided with Vaccine Information Sheet and instruction to access the V-Safe system.   Tara Acosta was instructed to call 911 with any severe reactions post vaccine: Marland Kitchen Difficulty breathing  . Swelling of face and throat  . A fast heartbeat  . A bad rash all over body  . Dizziness and weakness   Immunizations Administered    Name Date Dose VIS Date Route   Pfizer COVID-19 Vaccine 04/15/2019  3:37 PM 0.3 mL 12/14/2018 Intramuscular   Manufacturer: [employer], [employer]   Lot: [id number]   NDC: [address]-7

## 2019-10-08 ENCOUNTER — Encounter: Payer: Self-pay | Admitting: Internal Medicine

## 2019-10-08 ENCOUNTER — Ambulatory Visit (INDEPENDENT_AMBULATORY_CARE_PROVIDER_SITE_OTHER): Payer: Medicare Other | Admitting: Internal Medicine

## 2019-10-08 ENCOUNTER — Other Ambulatory Visit: Payer: Self-pay

## 2019-10-08 VITALS — BP 130/70 | HR 70 | Temp 98.3°F | Ht 68.0 in | Wt 162.7 lb

## 2019-10-08 DIAGNOSIS — R519 Headache, unspecified: Secondary | ICD-10-CM | POA: Diagnosis not present

## 2019-10-08 NOTE — Patient Instructions (Signed)
-  Nice seeing you today!!  -Pending vaccines: flu, COVID booster, tdap (tetanus) and shingles.  -Schedule follow up in 3 months for your physical. Please come in fasting that day.  -May use allergy med (zyrtec/claritin/allegra) and mucinex in addition to as needed ibuprofen for your HAs.

## 2019-10-08 NOTE — Progress Notes (Signed)
Established Patient Office Visit     This visit occurred during the SARS-CoV-2 public health emergency.  Safety protocols were in place, including screening questions prior to the visit, additional usage of staff PPE, and extensive cleaning of exam room while observing appropriate contact time as indicated for disinfecting solutions.    CC/Reason for Visit: Establish care, discuss headaches  HPI: Tara Acosta is a 69 y.o. female who is coming in today for the above mentioned reasons.  She has been blessed with good health and has no past medical history other than orthopedic history of bilateral hip replacements.  She has not been seen since Dr. Kirtland Bouchard retired in 2019.  She is overdue for flu, COVID booster, Tdap, shingles vaccine.  She is overdue for mammogram, Pap smear and colon cancer screening.  She has been having a lot of sinus headaches.  Pain is around her maxillary area extending into the frontal part of her head.  It improves greatly with ibuprofen.  Other than that she has no complaints.  Unfortunately her 55 year old mother passed away 3 weeks ago, she seems to be coping well with this.   Past Medical/Surgical History: Past Medical History:  Diagnosis Date  . Family history of anesthesia complication    family /w N&V  . GERD (gastroesophageal reflux disease)   . NEPHROLITHIASIS, HX OF 02/06/2007   cystoscopy- with anesth.   . OSTEOARTHRITIS 02/06/2007  . 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 COLONOSCOPY  2009  . TOTAL HIP ARTHROPLASTY Right 06/26/2012   Procedure: RIGHT TOTAL HIP ARTHROPLASTY ANTERIOR APPROACH;  Surgeon: Kathryne Hitch, MD;  Location: [facility] OR;  Service: Orthopedics;  Laterality: Right;  . TOTAL HIP ARTHROPLASTY Left 08/04/2015  . TOTAL HIP ARTHROPLASTY Left 08/04/2015   Procedure: LEFT TOTAL HIP ARTHROPLASTY ANTERIOR APPROACH;  Surgeon: Kathryne Hitch, MD;  Location: [facility] OR;  Service: Orthopedics;   Laterality: Left;  . TUBAL LIGATION      Social History:  reports that she has never smoked. She has never used smokeless tobacco. She reports current alcohol use. She reports that she does not use drugs.  Allergies: Allergies  Allergen Reactions  . Oxycodone Nausea Only    Family History:  Family History  Problem Relation Age of Onset  . Gout Father   . Hypertension Father   . Dementia Father   . Hypertension Mother   . Coronary artery disease Mother   . Pancreatic cancer Unknown        Uncle     Current Outpatient Medications:  .  ibuprofen (ADVIL,MOTRIN) 200 MG tablet, Take 200-400 mg by mouth every 6 (six) hours as needed for moderate pain. , Disp: , Rfl:  .  Multiple Vitamin (MULTIVITAMIN) tablet, Take 1 tablet by mouth daily., Disp: , Rfl:  .  vitamin C (ASCORBIC ACID) 500 MG tablet, Take 500 mg by mouth daily., Disp: , Rfl:   Review of Systems:  Constitutional: Denies fever, chills, diaphoresis, appetite change and fatigue.  HEENT: Denies photophobia, eye pain, redness, hearing loss, ear pain, congestion, sore throat, rhinorrhea, sneezing, mouth sores, trouble swallowing, neck pain, neck stiffness and tinnitus.   Respiratory: Denies SOB, DOE, cough, chest tightness,  and wheezing.   Cardiovascular: Denies chest pain, palpitations and leg swelling.  Gastrointestinal: Denies nausea, vomiting, abdominal pain, diarrhea, constipation, blood in stool and abdominal distention.  Genitourinary: Denies dysuria, urgency, frequency, hematuria, flank pain and difficulty urinating.  Endocrine: Denies:  hot or cold intolerance, sweats, changes in hair or nails, polyuria, polydipsia. Musculoskeletal: Denies myalgias, back pain, joint swelling, arthralgias and gait problem.  Skin: Denies pallor, rash and wound.  Neurological: Denies dizziness, seizures, syncope, weakness, light-headedness, numbness. Hematological: Denies adenopathy. Easy bruising, personal or family bleeding history    Psychiatric/Behavioral: Denies suicidal ideation, mood changes, confusion, nervousness, sleep disturbance and agitation    Physical Exam: Vitals:   10/08/19 1329  BP: 130/70  Pulse: 70  Temp: 98.3 F (36.8 C)  TempSrc: Oral  SpO2: 97%  Weight: 162 lb 11.2 oz (73.8 kg)  Height: 5\' 8"  (1.727 m)    Body mass index is 24.74 kg/m.   Constitutional: NAD, calm, comfortable Eyes: PERRL, lids and conjunctivae normal, wears corrective lenses ENMT: Mucous membranes are moist. Respiratory: clear to auscultation bilaterally, no wheezing, no crackles. Normal respiratory effort. No accessory muscle use.  Cardiovascular: Regular rate and rhythm, no murmurs / rubs / gallops. No extremity edema.  Neurologic: Grossly intact and nonfocal Psychiatric: Normal judgment and insight. Alert and oriented x 3. Normal mood.    Impression and Plan:  Sinus headache -Advised use of daily antihistamine, guaifenesin and as needed NSAIDs.  She declines all vaccine administration today, she states she will schedule her own mammogram, she is hesitant about colonoscopy versus Cologuard but will let me know her decision next visit.  She will schedule follow-up in 3 months or so for her physical.   Patient Instructions  -Nice seeing you today!!  -Pending vaccines: flu, COVID booster, tdap (tetanus) and shingles.  -Schedule follow up in 3 months for your physical. Please come in fasting that day.  -May use allergy med (zyrtec/claritin/allegra) and mucinex in addition to as needed ibuprofen for your HAs.     , MD [facility]

## 2019-10-10 LAB — HM MAMMOGRAPHY

## 2019-10-24 ENCOUNTER — Encounter: Payer: Self-pay | Admitting: Internal Medicine

## 2020-01-14 ENCOUNTER — Encounter: Payer: Self-pay | Admitting: Internal Medicine

## 2020-01-14 ENCOUNTER — Other Ambulatory Visit: Payer: Self-pay

## 2020-01-14 ENCOUNTER — Ambulatory Visit (INDEPENDENT_AMBULATORY_CARE_PROVIDER_SITE_OTHER): Payer: Medicare Other | Admitting: Internal Medicine

## 2020-01-14 VITALS — BP 120/78 | HR 72 | Temp 98.0°F | Ht 68.0 in | Wt 165.8 lb

## 2020-01-14 DIAGNOSIS — H6123 Impacted cerumen, bilateral: Secondary | ICD-10-CM

## 2020-01-14 DIAGNOSIS — K219 Gastro-esophageal reflux disease without esophagitis: Secondary | ICD-10-CM | POA: Diagnosis not present

## 2020-01-14 DIAGNOSIS — Z0000 Encounter for general adult medical examination without abnormal findings: Secondary | ICD-10-CM

## 2020-01-14 LAB — COMPREHENSIVE METABOLIC PANEL
ALT: 17 U/L (ref 0–35)
AST: 17 U/L (ref 0–37)
Albumin: 4.6 g/dL (ref 3.5–5.2)
Alkaline Phosphatase: 65 U/L (ref 39–117)
BUN: 14 mg/dL (ref 6–23)
CO2: 32 mEq/L (ref 19–32)
Calcium: 9.6 mg/dL (ref 8.4–10.5)
Chloride: 103 mEq/L (ref 96–112)
Creatinine, Ser: 0.82 mg/dL (ref 0.40–1.20)
GFR: 71.83 mL/min (ref >60.00)
Glucose, Bld: 92 mg/dL (ref 70–99)
Potassium: 4.5 mEq/L (ref 3.5–5.1)
Sodium: 140 mEq/L (ref 135–145)
Total Bilirubin: 0.6 mg/dL (ref 0.2–1.2)
Total Protein: 6.8 g/dL (ref 6.0–8.3)

## 2020-01-14 LAB — CBC WITH DIFFERENTIAL/PLATELET
Basophils Absolute: 0 10*3/uL (ref 0.0–0.1)
Basophils Relative: 0.7 % (ref 0.0–3.0)
Eosinophils Absolute: 0.1 10*3/uL (ref 0.0–0.7)
Eosinophils Relative: 2.8 % (ref 0.0–5.0)
HCT: 40.5 % (ref 36.0–46.0)
Hemoglobin: 13.6 g/dL (ref 12.0–15.0)
Lymphocytes Relative: 35.6 % (ref 12.0–46.0)
Lymphs Abs: 1.4 10*3/uL (ref 0.7–4.0)
MCHC: 33.7 g/dL (ref 30.0–36.0)
MCV: 91.7 fl (ref 78.0–100.0)
Monocytes Absolute: 0.3 10*3/uL (ref 0.1–1.0)
Monocytes Relative: 7.9 % (ref 3.0–12.0)
Neutro Abs: 2.1 10*3/uL (ref 1.4–7.7)
Neutrophils Relative %: 53 % (ref 43.0–77.0)
Platelets: 217 10*3/uL (ref 150.0–400.0)
RBC: 4.42 Mil/uL (ref 3.87–5.11)
RDW: 13.3 % (ref 11.5–15.5)
WBC: 4 10*3/uL (ref 4.0–10.5)

## 2020-01-14 LAB — LIPID PANEL
Cholesterol: 241 mg/dL — ABNORMAL HIGH (ref 0–200)
HDL: 62.6 mg/dL (ref >39.00)
LDL Cholesterol: 155 mg/dL — ABNORMAL HIGH (ref 0–99)
NonHDL: 178.54
Total CHOL/HDL Ratio: 4
Triglycerides: 117 mg/dL (ref 0.0–149.0)
VLDL: 23.4 mg/dL (ref 0.0–40.0)

## 2020-01-14 LAB — VITAMIN D 25 HYDROXY (VIT D DEFICIENCY, FRACTURES): VITD: 32.62 ng/mL (ref 30.00–100.00)

## 2020-01-14 LAB — TSH: TSH: 3.19 u[IU]/mL (ref 0.35–4.50)

## 2020-01-14 LAB — HEMOGLOBIN A1C: Hgb A1c MFr Bld: 5.5 % (ref 4.6–6.5)

## 2020-01-14 LAB — VITAMIN B12: Vitamin B-12: 246 pg/mL (ref 211–911)

## 2020-01-14 NOTE — Progress Notes (Signed)
Established Patient Office Visit     This visit occurred during the SARS-CoV-2 public health emergency.  Safety protocols were in place, including screening questions prior to the visit, additional usage of staff PPE, and extensive cleaning of exam room while observing appropriate contact time as indicated for disinfecting solutions.    CC/Reason for Visit:  annual preventive exam and subsequent Medicare wellness visit  HPI: Tara Acosta is a 70 y.o. female who is coming in today for the above mentioned reasons.  She has no past medical history of significance.  She has routine eye and dental care.  She has noticed a little decreased hearing in the past few months.  She is due for COVID booster, Tdap and shingles series.  She declines Pap smear and colon cancer screening despite counseling.  We have discussed colonoscopy and Cologuard and she refuses both for now but promises she will continue to think about it.  She had a mammogram in October 2021.  She has no acute complaints today.   Past Medical/Surgical History: Past Medical History:  Diagnosis Date  . Family history of anesthesia complication    family /w N&V  . GERD (gastroesophageal reflux disease)   . NEPHROLITHIASIS, HX OF 02/06/2007   cystoscopy- with anesth.   . OSTEOARTHRITIS 02/06/2007  . 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 COLONOSCOPY  2009  . TOTAL HIP ARTHROPLASTY Right 06/26/2012   Procedure: RIGHT TOTAL HIP ARTHROPLASTY ANTERIOR APPROACH;  Surgeon: Mcarthur Rossetti, MD;  Location: [place];  Service: Orthopedics;  Laterality: Right;  . TOTAL HIP ARTHROPLASTY Left 08/04/2015  . TOTAL HIP ARTHROPLASTY Left 08/04/2015   Procedure: LEFT TOTAL HIP ARTHROPLASTY ANTERIOR APPROACH;  Surgeon: Mcarthur Rossetti, MD;  Location: [place];  Service: Orthopedics;  Laterality: Left;  . TUBAL LIGATION      Social History:  reports that she has never smoked. She has never used  smokeless tobacco. She reports current alcohol use. She reports that she does not use drugs.  Allergies: Allergies  Allergen Reactions  . Oxycodone Nausea Only    Family History:  Family History  Problem Relation Age of Onset  . Gout Father   . Hypertension Father   . Dementia Father   . Hypertension Mother   . Coronary artery disease Mother   . Pancreatic cancer Unknown        Uncle     Current Outpatient Medications:  .  ibuprofen (ADVIL,MOTRIN) 200 MG tablet, Take 200-400 mg by mouth every 6 (six) hours as needed for moderate pain. , Disp: , Rfl:  .  Multiple Vitamin (MULTIVITAMIN) tablet, Take 1 tablet by mouth daily., Disp: , Rfl:  .  vitamin C (ASCORBIC ACID) 500 MG tablet, Take 500 mg by mouth daily., Disp: , Rfl:   Review of Systems:  Constitutional: Denies fever, chills, diaphoresis, appetite change and fatigue.  HEENT: Denies photophobia, eye pain, redness, hearing loss, ear pain, congestion, sore throat, rhinorrhea, sneezing, mouth sores, trouble swallowing, neck pain, neck stiffness and tinnitus.   Respiratory: Denies SOB, DOE, cough, chest tightness,  and wheezing.   Cardiovascular: Denies chest pain, palpitations and leg swelling.  Gastrointestinal: Denies nausea, vomiting, abdominal pain, diarrhea, constipation, blood in stool and abdominal distention.  Genitourinary: Denies dysuria, urgency, frequency, hematuria, flank pain and difficulty urinating.  Endocrine: Denies: hot or cold intolerance, sweats, changes in hair or nails, polyuria, polydipsia. Musculoskeletal: Denies myalgias, back pain, joint swelling, arthralgias  and gait problem.  Skin: Denies pallor, rash and wound.  Neurological: Denies dizziness, seizures, syncope, weakness, light-headedness, numbness and headaches.  Hematological: Denies adenopathy. Easy bruising, personal or family bleeding history  Psychiatric/Behavioral: Denies suicidal ideation, mood changes, confusion, nervousness, sleep  disturbance and agitation    Physical Exam: Vitals:   01/14/20 0932  BP: 120/78  Pulse: 72  Temp: 98 F (36.7 C)  TempSrc: Oral  SpO2: 98%  Weight: 165 lb 12.8 oz (75.2 kg)  Height: 5' 8"  (1.727 m)    Body mass index is 25.21 kg/m.   Constitutional: NAD, calm, comfortable Eyes: PERRL, lids and conjunctivae normal, wears corrective lenses ENMT: Mucous membranes are moist. Posterior pharynx clear of any exudate or lesions. Normal dentition. Tympanic membrane is obstructed by cerumen bilaterally. Neck: normal, supple, no masses, no thyromegaly Respiratory: clear to auscultation bilaterally, no wheezing, no crackles. Normal respiratory effort. No accessory muscle use.  Cardiovascular: Regular rate and rhythm, no murmurs / rubs / gallops. No extremity edema. 2+ pedal pulses.  Abdomen: no tenderness, no masses palpated. No hepatosplenomegaly. Bowel sounds positive.  Musculoskeletal: no clubbing / cyanosis. No joint deformity upper and lower extremities. Good ROM, no contractures. Normal muscle tone.  Skin: no rashes, lesions, ulcers. No induration Neurologic: CN 2-12 grossly intact. Sensation intact, DTR normal. Strength 5/5 in all 4.  Psychiatric: Normal judgment and insight. Alert and oriented x 3. Normal mood.    Subsequent Medicare wellness visit   1. Risk factors, based on past  M,S,F -cardiovascular disease risk factors include age, history of hyperlipidemia   2.  Physical activities: She walks routinely   3.  Depression/mood:  Stable, not depressed   4.  Hearing:  She has noticed some decreased hearing bilaterally over the last few months   5.  ADL's: Independent in all ADLs   6.  Fall risk:  Low fall risk   7.  Home safety: No problems identified   8.  Height weight, and visual acuity: height and weight as above, vision:   Visual Acuity Screening   Right eye Left eye Both eyes  Without correction:     With correction: 20/20 20/20 20/20      9.  Counseling:   Advised increase physical activity, we have discussed updating her immunizations and colon cancer screening   10. Lab orders based on risk factors: Laboratory update will be reviewed   11. Referral :  None today   12. Care plan:  Follow-up with me in 1 year   13. Cognitive assessment:  No cognitive impairment   14. Screening: Patient provided with a written and personalized 5-10 year screening schedule in the AVS.   yes   15. Provider List Update:   PCP only  16. Advance Directives: Full code   [place] Office Visit from 01/14/2020 in [place] [facility]  PHQ-9 Total Score 0      Fall Risk  01/14/2020 10/08/2019 06/28/2017 04/26/2016 01/31/2014  Falls in the past year? 0 1 No No No  Number falls in past yr: 0 0 - - -  Injury with Fall? 0 0 - - -     Impression and Plan:  Encounter for preventive health examination -She has routine eye and dental care. -We have discussed updating her immunization schedule with COVID booster, Tdap, shingles.  She will consider this and update them at pharmacy when she decides. -Screening labs today. -Healthy lifestyle discussed in detail. -She had her mammogram in October 2021. -We discussed updating  colon cancer screening, she declines both colonoscopy and Cologuard today despite counseling. -Pap smear declined.  Bilateral impacted cerumen -Cerumen Desimpaction  After patient consent was obtained, warm water was applied and gentle ear lavage performed on bilateral ears. There were no complications and following the desimpaction the tympanic membranes were visible. Tympanic membranes are intact following the procedure. Auditory canals are normal. The patient reported relief of symptoms after removal of cerumen.  Gastroesophageal reflux disease without esophagitis -Well-controlled, not on medication.   Patient Instructions   -Nice seeing you today!!  -Lab work today; will notify you once results are  available.  -Remember your COVID booster, tdap and shingles vaccine at your pharmacy,  -Please give some thought to colon cancer screening.  -Schedule follow up in 1 year or sooner as needed.   Preventive Care 68 Years and Older, Female Preventive care refers to lifestyle choices and visits with your health care provider that can promote health and wellness. This includes:  A yearly physical exam. This is also called an annual wellness visit.  Regular dental and eye exams.  Immunizations.  Screening for certain conditions.  Healthy lifestyle choices, such as: ? Eating a healthy diet. ? Getting regular exercise. ? Not using drugs or products that contain nicotine and tobacco. ? Limiting alcohol use. What can I expect for my preventive care visit? Physical exam Your health care provider will check your:  Height and weight. These may be used to calculate your BMI (body mass index). BMI is a measurement that tells if you are at a healthy weight.  Heart rate and blood pressure.  Body temperature.  Skin for abnormal spots. Counseling Your health care provider may ask you questions about your:  Past medical problems.  Family's medical history.  Alcohol, tobacco, and drug use.  Emotional well-being.  Home life and relationship well-being.  Sexual activity.  Diet, exercise, and sleep habits.  History of falls.  Memory and ability to understand (cognition).  Work and work Statistician.  Pregnancy and menstrual history.  Access to firearms. What immunizations do I need? Vaccines are usually given at various ages, according to a schedule. Your health care provider will recommend vaccines for you based on your age, medical history, and lifestyle or other factors, such as travel or where you work.   What tests do I need? Blood tests  Lipid and cholesterol levels. These may be checked every 5 years, or more often depending on your overall health.  Hepatitis C  test.  Hepatitis B test. Screening  Lung cancer screening. You may have this screening every year starting at age 77 if you have a 30-pack-year history of smoking and currently smoke or have quit within the past 15 years.  Colorectal cancer screening. ? All adults should have this screening starting at age 51 and continuing until age 71. ? Your health care provider may recommend screening at age 36 if you are at increased risk. ? You will have tests every 1-10 years, depending on your results and the type of screening test.  Diabetes screening. ? This is done by checking your blood sugar (glucose) after you have not eaten for a while (fasting). ? You may have this done every 1-3 years.  Mammogram. ? This may be done every 1-2 years. ? Talk with your health care provider about how often you should have regular mammograms.  Abdominal aortic aneurysm (AAA) screening. You may need this if you are a current or former smoker.  BRCA-related cancer screening.  This may be done if you have a family history of breast, ovarian, tubal, or peritoneal cancers. Other tests  STD (sexually transmitted disease) testing, if you are at risk.  Bone density scan. This is done to screen for osteoporosis. You may have this done starting at age 62. Talk with your health care provider about your test results, treatment options, and if necessary, the need for more tests. Follow these instructions at home: Eating and drinking  Eat a diet that includes fresh fruits and vegetables, whole grains, lean protein, and low-fat dairy products. Limit your intake of foods with high amounts of sugar, saturated fats, and salt.  Take vitamin and mineral supplements as recommended by your health care provider.  Do not drink alcohol if your health care provider tells you not to drink.  If you drink alcohol: ? Limit how much you have to 0-1 drink a day. ? Be aware of how much alcohol is in your drink. In the [place] one  drink equals one 12 oz bottle of beer (355 mL), one 5 oz glass of wine (148 mL), or one 1 oz glass of hard liquor (44 mL).   Lifestyle  Take daily care of your teeth and gums. Brush your teeth every morning and night with fluoride toothpaste. Floss one time each day.  Stay active. Exercise for at least 30 minutes 5 or more days each week.  Do not use any products that contain nicotine or tobacco, such as cigarettes, e-cigarettes, and chewing tobacco. If you need help quitting, ask your health care provider.  Do not use drugs.  If you are sexually active, practice safe sex. Use a condom or other form of protection in order to prevent STIs (sexually transmitted infections).  Talk with your health care provider about taking a low-dose aspirin or statin.  Find healthy ways to cope with stress, such as: ? Meditation, yoga, or listening to music. ? Journaling. ? Talking to a trusted person. ? Spending time with friends and family. Safety  Always wear your seat belt while driving or riding in a vehicle.  Do not drive: ? If you have been drinking alcohol. Do not ride with someone who has been drinking. ? When you are tired or distracted. ? While texting.  Wear a helmet and other protective equipment during sports activities.  If you have firearms in your house, make sure you follow all gun safety procedures. What's next?  Visit your health care provider once a year for an annual wellness visit.  Ask your health care provider how often you should have your eyes and teeth checked.  Stay up to date on all vaccines. This information is not intended to replace advice given to you by your health care provider. Make sure you discuss any questions you have with your health care provider. Document Revised: 12/11/2019 Document Reviewed: 12/14/2017 Elsevier Patient Education  2021 [place], MD [facility]

## 2020-01-14 NOTE — Addendum Note (Signed)
Addended by: Lerry Liner on: 01/14/2020 10:10 AM   Modules accepted: Orders

## 2020-01-14 NOTE — Patient Instructions (Signed)
-Nice seeing you today!!  -Lab work today; will notify you once results are available.  -Remember your COVID booster, tdap and shingles vaccine at your pharmacy,  -Please give some thought to colon cancer screening.  -Schedule follow up in 1 year or sooner as needed.   Preventive Care 2 Years and Older, Female Preventive care refers to lifestyle choices and visits with your health care provider that can promote health and wellness. This includes:  A yearly physical exam. This is also called an annual wellness visit.  Regular dental and eye exams.  Immunizations.  Screening for certain conditions.  Healthy lifestyle choices, such as: ? Eating a healthy diet. ? Getting regular exercise. ? Not using drugs or products that contain nicotine and tobacco. ? Limiting alcohol use. What can I expect for my preventive care visit? Physical exam Your health care provider will check your:  Height and weight. These may be used to calculate your BMI (body mass index). BMI is a measurement that tells if you are at a healthy weight.  Heart rate and blood pressure.  Body temperature.  Skin for abnormal spots. Counseling Your health care provider may ask you questions about your:  Past medical problems.  Family's medical history.  Alcohol, tobacco, and drug use.  Emotional well-being.  Home life and relationship well-being.  Sexual activity.  Diet, exercise, and sleep habits.  History of falls.  Memory and ability to understand (cognition).  Work and work Statistician.  Pregnancy and menstrual history.  Access to firearms. What immunizations do I need? Vaccines are usually given at various ages, according to a schedule. Your health care provider will recommend vaccines for you based on your age, medical history, and lifestyle or other factors, such as travel or where you work.   What tests do I need? Blood tests  Lipid and cholesterol levels. These may be checked  every 5 years, or more often depending on your overall health.  Hepatitis C test.  Hepatitis B test. Screening  Lung cancer screening. You may have this screening every year starting at age 75 if you have a 30-pack-year history of smoking and currently smoke or have quit within the past 15 years.  Colorectal cancer screening. ? All adults should have this screening starting at age 50 and continuing until age 7. ? Your health care provider may recommend screening at age 52 if you are at increased risk. ? You will have tests every 1-10 years, depending on your results and the type of screening test.  Diabetes screening. ? This is done by checking your blood sugar (glucose) after you have not eaten for a while (fasting). ? You may have this done every 1-3 years.  Mammogram. ? This may be done every 1-2 years. ? Talk with your health care provider about how often you should have regular mammograms.  Abdominal aortic aneurysm (AAA) screening. You may need this if you are a current or former smoker.  BRCA-related cancer screening. This may be done if you have a family history of breast, ovarian, tubal, or peritoneal cancers. Other tests  STD (sexually transmitted disease) testing, if you are at risk.  Bone density scan. This is done to screen for osteoporosis. You may have this done starting at age 73. Talk with your health care provider about your test results, treatment options, and if necessary, the need for more tests. Follow these instructions at home: Eating and drinking  Eat a diet that includes fresh fruits and vegetables, whole grains,  lean protein, and low-fat dairy products. Limit your intake of foods with high amounts of sugar, saturated fats, and salt.  Take vitamin and mineral supplements as recommended by your health care provider.  Do not drink alcohol if your health care provider tells you not to drink.  If you drink alcohol: ? Limit how much you have to 0-1 drink  a day. ? Be aware of how much alcohol is in your drink. In the [place] one drink equals one 12 oz bottle of beer (355 mL), one 5 oz glass of wine (148 mL), or one 1 oz glass of hard liquor (44 mL).   Lifestyle  Take daily care of your teeth and gums. Brush your teeth every morning and night with fluoride toothpaste. Floss one time each day.  Stay active. Exercise for at least 30 minutes 5 or more days each week.  Do not use any products that contain nicotine or tobacco, such as cigarettes, e-cigarettes, and chewing tobacco. If you need help quitting, ask your health care provider.  Do not use drugs.  If you are sexually active, practice safe sex. Use a condom or other form of protection in order to prevent STIs (sexually transmitted infections).  Talk with your health care provider about taking a low-dose aspirin or statin.  Find healthy ways to cope with stress, such as: ? Meditation, yoga, or listening to music. ? Journaling. ? Talking to a trusted person. ? Spending time with friends and family. Safety  Always wear your seat belt while driving or riding in a vehicle.  Do not drive: ? If you have been drinking alcohol. Do not ride with someone who has been drinking. ? When you are tired or distracted. ? While texting.  Wear a helmet and other protective equipment during sports activities.  If you have firearms in your house, make sure you follow all gun safety procedures. What's next?  Visit your health care provider once a year for an annual wellness visit.  Ask your health care provider how often you should have your eyes and teeth checked.  Stay up to date on all vaccines. This information is not intended to replace advice given to you by your health care provider. Make sure you discuss any questions you have with your health care provider. Document Revised: 12/11/2019 Document Reviewed: 12/14/2017 Elsevier Patient Education  2021 [employer].

## 2021-01-18 ENCOUNTER — Ambulatory Visit
Admission: EM | Admit: 2021-01-18 | Discharge: 2021-01-18 | Disposition: A | Discharge status: Home / Self Care | Payer: Medicare Other | Attending: Physician Assistant | Admitting: Physician Assistant

## 2021-01-18 ENCOUNTER — Ambulatory Visit (INDEPENDENT_AMBULATORY_CARE_PROVIDER_SITE_OTHER): Payer: Medicare Other

## 2021-01-18 ENCOUNTER — Encounter: Payer: Self-pay | Admitting: Emergency Medicine

## 2021-01-18 DIAGNOSIS — H9193 Unspecified hearing loss, bilateral: Secondary | ICD-10-CM | POA: Diagnosis not present

## 2021-01-18 DIAGNOSIS — R0789 Other chest pain: Secondary | ICD-10-CM

## 2021-01-18 DIAGNOSIS — R059 Cough, unspecified: Secondary | ICD-10-CM

## 2021-01-18 DIAGNOSIS — H6123 Impacted cerumen, bilateral: Secondary | ICD-10-CM | POA: Diagnosis not present

## 2021-01-18 DIAGNOSIS — J189 Pneumonia, unspecified organism: Secondary | ICD-10-CM

## 2021-01-18 DIAGNOSIS — H9190 Unspecified hearing loss, unspecified ear: Secondary | ICD-10-CM

## 2021-01-18 MED ORDER — AMOXICILLIN 500 MG PO CAPS
500.0000 mg | ORAL_CAPSULE | Freq: Three times a day (TID) | ORAL | 0 refills | Status: DC
Start: 2021-01-18 — End: 2021-03-31

## 2021-01-18 NOTE — ED Triage Notes (Signed)
Pt here with bilateral otalgia, cough and congestion x 2 weeks.

## 2021-01-18 NOTE — ED Provider Notes (Addendum)
Tara Acosta    CSN: [id number] Arrival date & time: 01/18/21  6967      History   Chief Complaint Chief Complaint  Patient presents with   Otalgia   Nasal Congestion   Cough    HPI Tara Acosta is a 71 y.o. female.   Patient complains of decreased hearing bilateral ears patient reports she has also had a cough for the past 3 weeks.  Patient reports she had a negative flu and COVID test.  Patient states that her provider gave her a prescription for Zithromax which she has finished she reports cough has continued.  Patient would like to have a chest x-ray.  Patient complains of pain on the right side of her chest.   Otalgia Location:  Bilateral Timing:  Constant Associated symptoms: cough   Cough Associated symptoms: ear pain    Past Medical History:  Diagnosis Date   Family history of anesthesia complication    family /w N&V   GERD (gastroesophageal reflux disease)    NEPHROLITHIASIS, HX OF 02/06/2007   cystoscopy- with anesth.    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   Osteoarthritis 02/06/2007   NEPHROLITHIASIS, HX OF 02/06/2007    Past Surgical History:  Procedure Laterality Date   COLONOSCOPY  2009   TOTAL HIP ARTHROPLASTY Right 06/26/2012   Procedure: RIGHT TOTAL HIP ARTHROPLASTY ANTERIOR APPROACH;  Surgeon: Kathryne Hitch, MD;  Location: [facility] OR;  Service: Orthopedics;  Laterality: Right;   TOTAL HIP ARTHROPLASTY Left 08/04/2015   TOTAL HIP ARTHROPLASTY Left 08/04/2015   Procedure: LEFT TOTAL HIP ARTHROPLASTY ANTERIOR APPROACH;  Surgeon:  Kathryne Hitch, MD;  Location: [facility] OR;  Service: Orthopedics;  Laterality: Left;   TUBAL LIGATION      OB History   No obstetric history on file.      Home Medications    Prior to Admission medications   Medication Sig Start Date End Date Taking? Authorizing Provider  ibuprofen (ADVIL,MOTRIN) 200 MG tablet Take 200-400 mg by mouth every 6 (six) hours as needed for moderate pain.     [provider]  Multiple Vitamin (MULTIVITAMIN) tablet Take 1 tablet by mouth daily.    [provider]  vitamin C (ASCORBIC ACID) 500 MG tablet Take 500 mg by mouth daily.    [provider]    Family History Family History  Problem Relation Age of Onset   Gout Father    Hypertension Father    Dementia Father    Hypertension Mother    Coronary artery disease Mother    Pancreatic cancer Unknown        Uncle    Social History Social History   Tobacco Use   Smoking status: Never   Smokeless tobacco: Never  Substance Use Topics   Alcohol use: Yes    Comment: social   Drug use: No     Allergies   Oxycodone   Review of Systems Review of Systems  HENT:  Positive for ear pain.   Respiratory:  Positive for cough.   All other systems reviewed and are  negative.   Physical Exam Triage Vital Signs ED Triage Vitals  Enc Vitals Group     BP 01/18/21 0935 (!) 147/88     Pulse Rate 01/18/21 0935 97     Resp 01/18/21 0935 18     Temp 01/18/21 0935 98.4 F (36.9 C)     Temp Source 01/18/21 0935 Oral     SpO2 01/18/21 0935 95 %     Weight --      Height --      Head Circumference --      Peak Flow --      Pain Score 01/18/21 0946 5     Pain Loc --      Pain Edu? --      Excl. in GC? --    No data found.  Updated Vital Signs BP (!) 147/88 (BP Location: Left Arm)    Pulse 97    Temp 98.4 F (36.9 C) (Oral)    Resp 18    SpO2 95%   Visual Acuity Right Eye Distance:   Left Eye Distance:   Bilateral Distance:    Right Eye Near:   Left Eye  Near:    Bilateral Near:     Physical Exam Vitals and nursing note reviewed.  Constitutional:      Appearance: She is well-developed.  HENT:     Head: Normocephalic and atraumatic.     Right Ear: There is impacted cerumen.     Left Ear: There is impacted cerumen.  Pulmonary:     Effort: Pulmonary effort is normal.  Abdominal:     General: There is no distension.  Musculoskeletal:        General: Normal range of motion.     Cervical back: Normal range of motion.  Skin:    General: Skin is warm.  Neurological:     Mental Status: She is alert and oriented to person, place, and time.  Psychiatric:        Mood and Affect: Mood normal.     UC Treatments / Results  Labs (all labs ordered are listed, but only abnormal results are displayed) Labs Reviewed - No data to display  EKG   Radiology No results found.  Procedures Procedures (including critical care time)  Medications Ordered in UC Medications - No data to display  Initial Impression / Assessment and Plan / UC Course  I have reviewed the triage vital signs and the nursing notes.  Pertinent labs & imaging results that were available during my care of the patient were reviewed by me and considered in my medical decision making (see chart for details). Pt's ears irrigatted by Rn.  Reexam wax removed   MDM chest xray shows possible pneumonia  Pt advised no evidence of ear infection.  Pt advised to follow up with ENT if not improving now that ear wax is removed  Final Clinical Impressions(s) / UC Diagnoses   Final diagnoses:  Pneumonia due to infectious organism, unspecified laterality, unspecified part of lung  Bilateral impacted cerumen  Hearing loss, unspecified hearing loss type, unspecified laterality     Discharge Instructions      Schedule to see ENT if hearing problems persist     ED Prescriptions     Medication Sig Dispense Auth. Provider   amoxicillin (AMOXIL) 500 MG capsule Take 1 capsule  (500 mg total) by mouth 3 (three) times daily. 30 capsule Elson AreasSofia, Haylo Fake K, [place]     An After Visit Summary was printed  and given to the patient.  PDMP not reviewed this encounter.   Elson Areas, PA-C 01/18/21 1030    [address] [place] 02/19/21 1039

## 2021-01-18 NOTE — Discharge Instructions (Signed)
Schedule to see ENT if hearing problems persist

## 2021-03-16 LAB — HM MAMMOGRAPHY

## 2021-03-18 ENCOUNTER — Encounter: Payer: Self-pay | Admitting: Internal Medicine

## 2021-03-31 ENCOUNTER — Encounter: Payer: Self-pay | Admitting: Internal Medicine

## 2021-03-31 ENCOUNTER — Ambulatory Visit (INDEPENDENT_AMBULATORY_CARE_PROVIDER_SITE_OTHER): Payer: Medicare Other | Admitting: Internal Medicine

## 2021-03-31 ENCOUNTER — Other Ambulatory Visit: Payer: Self-pay | Admitting: Internal Medicine

## 2021-03-31 VITALS — BP 120/84 | HR 88 | Temp 97.8°F | Ht 68.0 in | Wt 164.2 lb

## 2021-03-31 DIAGNOSIS — Z13820 Encounter for screening for osteoporosis: Secondary | ICD-10-CM

## 2021-03-31 DIAGNOSIS — Z0000 Encounter for general adult medical examination without abnormal findings: Secondary | ICD-10-CM | POA: Diagnosis not present

## 2021-03-31 DIAGNOSIS — Z1211 Encounter for screening for malignant neoplasm of colon: Secondary | ICD-10-CM | POA: Diagnosis not present

## 2021-03-31 DIAGNOSIS — E785 Hyperlipidemia, unspecified: Secondary | ICD-10-CM | POA: Insufficient documentation

## 2021-03-31 DIAGNOSIS — Z780 Asymptomatic menopausal state: Secondary | ICD-10-CM

## 2021-03-31 DIAGNOSIS — E782 Mixed hyperlipidemia: Secondary | ICD-10-CM

## 2021-03-31 LAB — LIPID PANEL
Cholesterol: 242 mg/dL — ABNORMAL HIGH (ref 0–200)
HDL: 61.4 mg/dL (ref >39.00)
LDL Cholesterol: 160 mg/dL — ABNORMAL HIGH (ref 0–99)
NonHDL: 180.54
Total CHOL/HDL Ratio: 4
Triglycerides: 105 mg/dL (ref 0.0–149.0)
VLDL: 21 mg/dL (ref 0.0–40.0)

## 2021-03-31 LAB — CBC WITH DIFFERENTIAL/PLATELET
Basophils Absolute: 0 10*3/uL (ref 0.0–0.1)
Basophils Relative: 0.8 % (ref 0.0–3.0)
Eosinophils Absolute: 0.1 10*3/uL (ref 0.0–0.7)
Eosinophils Relative: 2.6 % (ref 0.0–5.0)
HCT: 41.5 % (ref 36.0–46.0)
Hemoglobin: 13.8 g/dL (ref 12.0–15.0)
Lymphocytes Relative: 37.2 % (ref 12.0–46.0)
Lymphs Abs: 1.6 10*3/uL (ref 0.7–4.0)
MCHC: 33.3 g/dL (ref 30.0–36.0)
MCV: 92.6 fl (ref 78.0–100.0)
Monocytes Absolute: 0.4 10*3/uL (ref 0.1–1.0)
Monocytes Relative: 9.4 % (ref 3.0–12.0)
Neutro Abs: 2.2 10*3/uL (ref 1.4–7.7)
Neutrophils Relative %: 50 % (ref 43.0–77.0)
Platelets: 211 10*3/uL (ref 150.0–400.0)
RBC: 4.48 Mil/uL (ref 3.87–5.11)
RDW: 13.4 % (ref 11.5–15.5)
WBC: 4.4 10*3/uL (ref 4.0–10.5)

## 2021-03-31 LAB — COMPREHENSIVE METABOLIC PANEL
ALT: 17 U/L (ref 0–35)
AST: 23 U/L (ref 0–37)
Albumin: 4.4 g/dL (ref 3.5–5.2)
Alkaline Phosphatase: 66 U/L (ref 39–117)
BUN: 14 mg/dL (ref 6–23)
CO2: 29 mEq/L (ref 19–32)
Calcium: 9.7 mg/dL (ref 8.4–10.5)
Chloride: 101 mEq/L (ref 96–112)
Creatinine, Ser: 0.85 mg/dL (ref 0.40–1.20)
GFR: 68.22 mL/min (ref >60.00)
Glucose, Bld: 92 mg/dL (ref 70–99)
Potassium: 4.3 mEq/L (ref 3.5–5.1)
Sodium: 138 mEq/L (ref 135–145)
Total Bilirubin: 0.6 mg/dL (ref 0.2–1.2)
Total Protein: 7.2 g/dL (ref 6.0–8.3)

## 2021-03-31 LAB — VITAMIN D 25 HYDROXY (VIT D DEFICIENCY, FRACTURES): VITD: 31.55 ng/mL (ref 30.00–100.00)

## 2021-03-31 MED ORDER — TETANUS-DIPHTH-ACELL PERTUSSIS 5-2.5-18.5 LF-MCG/0.5 IM SUSP: 0.5000 mL | Freq: Once | INTRAMUSCULAR | 0 refills | Status: DC

## 2021-03-31 MED ORDER — ATORVASTATIN CALCIUM 20 MG PO TABS: 20.0000 mg | ORAL_TABLET | Freq: Every day | ORAL | 1 refills | Status: AC

## 2021-03-31 MED ORDER — TETANUS-DIPHTH-ACELL PERTUSSIS 5-2-15.5 LF-MCG/0.5 IM SUSP: 0.5000 mL | Freq: Once | INTRAMUSCULAR | 0 refills | Status: AC

## 2021-03-31 NOTE — Patient Instructions (Signed)
-  Nice seeing you today!! ? ?-Lab work today; will notify you once results are available. ? ?-Remember your COVID booster, Tdap and shingles vaccines at your pharmacy. ? ?-Schedule follow up in 1 year or sooner as needed. ? ? ?

## 2021-03-31 NOTE — Addendum Note (Signed)
Addended by: Westley Hummer B on: 03/31/2021 04:28 PM ? ? Modules accepted: Orders ? ?

## 2021-03-31 NOTE — Progress Notes (Signed)
? ? ? ?Established Patient Office Visit ? ? ? ? ?This visit occurred during the SARS-CoV-2 public health emergency.  Safety protocols were in place, including screening questions prior to the visit, additional usage of staff PPE, and extensive cleaning of exam room while observing appropriate contact time as indicated for disinfecting solutions.  ? ? ?CC/Reason for Visit: Annual preventive exam and subsequent Medicare wellness visit ? ?HPI: Tara Acosta is a 71 y.o. female who is coming in today for the above mentioned reasons.  She has no past medical history of significance.  In January she was diagnosed with pneumonia at urgent care and treated with antibiotics, she is improved.  She has routine eye and dental care.  [facility] at least 3 days a week.  She is overdue for COVID booster, Tdap and shingles vaccines.  She is due for bone density test for colon cancer screening.  Mammogram is up-to-date, she declines Pap smear. ? ?Past Medical/Surgical History: ?Past Medical History:  ?Diagnosis Date  ? Family history of anesthesia complication   ? family /w N&V  ? GERD (gastroesophageal reflux disease)   ? NEPHROLITHIASIS, HX OF 02/06/2007  ? cystoscopy- with anesth.   ? OSTEOARTHRITIS 02/06/2007  ? 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 ? ?Past Surgical History:  ?Procedure Laterality Date  ? COLONOSCOPY  2009  ? TOTAL HIP ARTHROPLASTY Right 06/26/2012  ? Procedure: RIGHT TOTAL HIP ARTHROPLASTY ANTERIOR APPROACH;  Surgeon: Kathryne Hitchhristopher Y Blackman, MD;  [facility];  Service: Orthopedics;  Laterality: Right;  ? TOTAL HIP ARTHROPLASTY Left 08/04/2015  ? TOTAL HIP ARTHROPLASTY Left 08/04/2015  ? Procedure: LEFT TOTAL HIP ARTHROPLASTY ANTERIOR APPROACH;  Surgeon: Kathryne Hitchhristopher Y Blackman, MD;  [facility];  Service: Orthopedics;  Laterality: Left;  ? TUBAL LIGATION    ? ? ?Social History: ? reports that she has never smoked. She has never used smokeless tobacco. She reports current alcohol use. She reports  that she does not use drugs. ? ?Allergies: ?Allergies  ?Allergen Reactions  ? Oxycodone Nausea Only  ? ? ?Family History:  ?Family History  ?Problem Relation Age of Onset  ? Gout Father   ? Hypertension Father   ? Dementia Father   ? Hypertension Mother   ? Coronary artery disease Mother   ? Pancreatic cancer Unknown   ?     Uncle  ? ? ? ?Current Outpatient Medications:  ?  ibuprofen (ADVIL,MOTRIN) 200 MG tablet, Take 200-400 mg by mouth every 6 (six) hours as needed for moderate pain. , Disp: , Rfl:  ?  Multiple Vitamin (MULTIVITAMIN) tablet, Take 1 tablet by mouth daily., Disp: , Rfl:  ?  Tdap (BOOSTRIX) 5-2.5-18.5 LF-MCG/0.5 injection, Inject 0.5 mLs into the muscle once for 1 dose., Disp: 0.5 mL, Rfl: 0 ?  triamcinolone cream (KENALOG) 0.1 %, SMARTSIG:1 sparingly Topical Twice Daily PRN, Disp: , Rfl:  ?  vitamin C (ASCORBIC ACID) 500 MG tablet, Take 500 mg by mouth daily., Disp: , Rfl:  ? ?Review of Systems:  ?Constitutional: Denies fever, chills, diaphoresis, appetite change and fatigue.  ?HEENT: Denies photophobia, eye pain, redness, hearing loss, ear pain, congestion, sore throat, rhinorrhea, sneezing, mouth sores, trouble swallowing, neck pain, neck stiffness and tinnitus.   ?Respiratory: Denies SOB, DOE, cough, chest tightness,  and wheezing.   ?Cardiovascular: Denies chest pain, palpitations and leg swelling.  ?Gastrointestinal: Denies nausea, vomiting, abdominal pain, diarrhea, constipation, blood in stool and abdominal distention.  ?Genitourinary: Denies dysuria, urgency, frequency, hematuria, flank  pain and difficulty urinating.  ?Endocrine: Denies: hot or cold intolerance, sweats, changes in hair or nails, polyuria, polydipsia. ?Musculoskeletal: Denies myalgias, back pain, joint swelling, arthralgias and gait problem.  ?Skin: Denies pallor, rash and wound.  ?Neurological: Denies dizziness, seizures, syncope, weakness, light-headedness, numbness and headaches.  ?Hematological: Denies adenopathy. Easy  bruising, personal or family bleeding history  ?Psychiatric/Behavioral: Denies suicidal ideation, mood changes, confusion, nervousness, sleep disturbance and agitation ? ? ? ?Physical Exam: ?Vitals:  ? 03/31/21 0934  ?BP: 120/84  ?Pulse: 88  ?Temp: 97.8 ?F (36.6 ?C)  ?TempSrc: Oral  ?SpO2: 98%  ?Weight: 164 lb 3.2 oz (74.5 kg)  ?Height: 5\' 8"  (1.727 m)  ? ? ?Body mass index is 24.97 kg/m?. ? ? ?Constitutional: NAD, calm, comfortable ?Eyes: PERRL, lids and conjunctivae normal, wears corrective lenses ?ENMT: Mucous membranes are moist. Posterior pharynx clear of any exudate or lesions. Normal dentition. Tympanic membrane is pearly white, no erythema or bulging. ?Neck: normal, supple, no masses, no thyromegaly ?Respiratory: clear to auscultation bilaterally, no wheezing, no crackles. Normal respiratory effort. No accessory muscle use.  ?Cardiovascular: Regular rate and rhythm, no murmurs / rubs / gallops. No extremity edema. 2+ pedal pulses. No carotid bruits.  ?Abdomen: no tenderness, no masses palpated. No hepatosplenomegaly. Bowel sounds positive.  ?Musculoskeletal: no clubbing / cyanosis. No joint deformity upper and lower extremities. Good ROM, no contractures. Normal muscle tone.  ?Skin: no rashes, lesions, ulcers. No induration ?Neurologic: CN 2-12 grossly intact. Sensation intact, DTR normal. Strength 5/5 in all 4.  ?Psychiatric: Normal judgment and insight. Alert and oriented x 3. Normal mood.  ? ?Subsequent Medicare wellness visit ?  ?1. Risk factors, based on past  M,S,F -she has no cardiovascular disease risk factors ?  ?2.  Physical activities: Exercises 3 days a week ?  ?3.  Depression/mood: Stable, not depressed ?  ?4.  Hearing: Mild hearing decrease during her pneumonia/URI episode that is improved ?  ?5.  ADL's: Independent in all ADLs ?  ?6.  Fall risk: Low fall risk ?  ?7.  Home safety: No problems identified ?  ?8.  Height weight, and visual acuity: height and weight as above, vision: ? ?Vision  Screening  ? Right eye Left eye Both eyes  ?Without correction     ?With correction 20/25 20/25 20/25   ? ?  ?9.  Counseling: Advised to update vaccination status ?  ?10. Lab orders based on risk factors: Laboratory update will be reviewed ?  ?11. Referral : None today ?  ?12. Care plan: Follow-up with me in 1 year ?  ?13. Cognitive assessment: No cognitive impairment ?  ?14. Screening: Patient provided with a written and personalized 5-10 year screening schedule in the AVS. yes ?  ?15. Provider List Update: PCP only ? ?16. Advance Directives: Full code ? ? ?17. Opioids: Patient is not on any opioid prescriptions and has no risk factors for a substance use disorder. ? ? ?Flowsheet Row Office Visit from 03/31/2021 in [facility] [place]  ?PHQ-9 Total Score 1  ? ?  ? ? ? ?  06/28/2017  ?  9:23 AM 10/08/2019  ?  1:28 PM 01/14/2020  ?  9:31 AM 01/18/2021  ?  9:46 AM 03/31/2021  ?  9:42 AM  ?Fall Risk  ?Falls in the past year? No 1 0  0  ?Was there an injury with Fall?  0 0  0  ?Fall Risk Category Calculator  1 0  0  ?Fall Risk Category  Low Low  Low  ?Patient Fall Risk Level    Low fall risk Low fall risk  ?Patient at Risk for Falls Due to     No Fall Risks  ?Fall risk Follow up     Falls evaluation completed  ? ? ? ?Impression and Plan: ? ?Encounter for preventive health examination  ?- Plan: CBC with Differential/Platelet, Comprehensive metabolic panel, Lipid panel, VITAMIN D 25 Hydroxy (Vit-D Deficiency, Fractures) ?-Recommend routine eye and dental care. ?-Immunizations: Advised to get COVID booster, Tdap, shingles vaccinations at pharmacy ?-Healthy lifestyle discussed in detail. ?-Labs to be updated today. ?-Colon cancer screening: She has elected Cologuard after counseling ?-Breast cancer screening: 03/2021 ?-Cervical cancer screening: Declines due to age ?-Lung cancer screening: Not applicable ?-Prostate cancer screening: Not applicable ?-DEXA: Requested today ? ?Encounter for osteoporosis screening in  asymptomatic postmenopausal patient  ?- Plan: DG Bone Density, VITAMIN D 25 Hydroxy (Vit-D Deficiency, Fractures) ? ? ? ? ?Patient Instructions  ?-Nice seeing you today!! ? ?-Lab work today; will notify you onc

## 2021-04-01 NOTE — Addendum Note (Signed)
Addended by: Elza Rafter D on: 04/01/2021 02:02 PM ? ? Modules accepted: Orders ? ?

## 2021-04-06 ENCOUNTER — Ambulatory Visit (INDEPENDENT_AMBULATORY_CARE_PROVIDER_SITE_OTHER)
Admission: RE | Admit: 2021-04-06 | Discharge: 2021-04-06 | Disposition: A | Discharge status: Home / Self Care | Payer: Medicare Other | Source: Ambulatory Visit | Attending: Internal Medicine | Admitting: Internal Medicine

## 2021-04-06 DIAGNOSIS — Z780 Asymptomatic menopausal state: Secondary | ICD-10-CM

## 2021-04-06 DIAGNOSIS — Z13820 Encounter for screening for osteoporosis: Secondary | ICD-10-CM

## 2021-04-28 LAB — COLOGUARD: COLOGUARD: NEGATIVE

## 2021-05-21 ENCOUNTER — Ambulatory Visit
Admission: RE | Admit: 2021-05-21 | Discharge: 2021-05-21 | Disposition: A | Discharge status: Home / Self Care | Payer: Medicare Other | Source: Ambulatory Visit | Attending: Emergency Medicine | Admitting: Emergency Medicine

## 2021-05-21 VITALS — BP 149/82 | HR 101 | Temp 98.6°F | Resp 18

## 2021-05-21 DIAGNOSIS — N390 Urinary tract infection, site not specified: Secondary | ICD-10-CM

## 2021-05-21 DIAGNOSIS — R319 Hematuria, unspecified: Secondary | ICD-10-CM | POA: Diagnosis not present

## 2021-05-21 LAB — POCT URINALYSIS DIP (MANUAL ENTRY)
Bilirubin, UA: NEGATIVE
Glucose, UA: NEGATIVE mg/dL
Ketones, POC UA: NEGATIVE mg/dL
Nitrite, UA: POSITIVE — AB
Protein Ur, POC: NEGATIVE mg/dL
Spec Grav, UA: 1.015 (ref 1.010–1.025)
Urobilinogen, UA: 0.2 E.U./dL
pH, UA: 7 (ref 5.0–8.0)

## 2021-05-21 MED ORDER — CEPHALEXIN 500 MG PO CAPS: 500.0000 mg | ORAL_CAPSULE | Freq: Two times a day (BID) | ORAL | 0 refills | Status: AC

## 2021-05-21 NOTE — Discharge Instructions (Addendum)
Take the antibiotic as directed.  The urine culture is pending.  We will call you if it shows the need to change or discontinue your antibiotic.    Follow up with your primary care provider if your symptoms are not improving.    

## 2021-05-21 NOTE — ED Provider Notes (Signed)
Tara Acosta    CSN: [id number] Arrival date & time: 05/21/21  1303      History   Chief Complaint Chief Complaint  Patient presents with   Urinary Frequency   Dysuria    HPI Tara Acosta is a 71 y.o. female.  Patient presents with 4-day history of dysuria and frequency.  Treated at home with Azo 2 days ago.  He denies fever, chills, abdominal pain, hematuria, flank pain, vaginal discharge, pelvic pain, or other symptoms.  Her medical history includes kidney stones.  The history is provided by the patient and medical records.   Past Medical History:  Diagnosis Date   Family history of anesthesia complication    family /w N&V   GERD (gastroesophageal reflux disease)    NEPHROLITHIASIS, HX OF 02/06/2007   cystoscopy- with anesth.    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   Osteoarthritis 02/06/2007   NEPHROLITHIASIS, HX OF 02/06/2007    Past Surgical History:  Procedure Laterality Date   COLONOSCOPY  2009   TOTAL HIP ARTHROPLASTY Right 06/26/2012   Procedure: RIGHT TOTAL HIP ARTHROPLASTY ANTERIOR APPROACH;  Surgeon: Mcarthur Rossetti, MD;  Location: [facility];  Service: Orthopedics;  Laterality: Right;   TOTAL HIP ARTHROPLASTY Left 08/04/2015   TOTAL HIP ARTHROPLASTY Left 08/04/2015   Procedure: LEFT TOTAL HIP ARTHROPLASTY ANTERIOR APPROACH;  Surgeon: Mcarthur Rossetti, MD;  Location: [facility];  Service: Orthopedics;  Laterality: Left;   TUBAL LIGATION      OB History   No obstetric history on file.      Home Medications     Prior to Admission medications   Medication Sig Start Date End Date Taking? Authorizing Provider  cephALEXin (KEFLEX) 500 MG capsule Take 1 capsule (500 mg total) by mouth 2 (two) times daily for 5 days. 05/21/21 05/26/21 Yes Sharion Balloon, NP  Multiple Vitamin (MULTIVITAMIN) tablet Take 1 tablet by mouth daily.   Yes [provider]  vitamin C (ASCORBIC ACID) 500 MG tablet Take 500 mg by mouth daily.   Yes [provider]  atorvastatin (LIPITOR) 20 MG tablet Take 1 tablet (20 mg total) by mouth daily. 03/31/21   Isaac Bliss, Rayford Halsted, MD  ibuprofen (ADVIL,MOTRIN) 200 MG tablet Take 200-400 mg by mouth every 6 (six) hours as needed for moderate pain.     [provider]  triamcinolone cream (KENALOG) 0.1 % SMARTSIG:1 sparingly Topical Twice Daily PRN 02/19/21   [provider]    Family History Family History  Problem Relation Age of Onset   Gout Father    Hypertension Father    Dementia Father    Hypertension Mother    Coronary artery disease Mother    Pancreatic cancer Unknown        Uncle    Social History Social History   Tobacco Use   Smoking status: Never   Smokeless tobacco: Never  Substance Use Topics   Alcohol use: Yes    Comment: social   Drug use: No     Allergies   Oxycodone  Review of Systems Review of Systems  Constitutional:  Negative for chills and fever.  Gastrointestinal:  Negative for abdominal pain, nausea and vomiting.  Genitourinary:  Positive for dysuria and frequency. Negative for flank pain, hematuria, pelvic pain and vaginal discharge.  Skin:  Negative for color change and rash.  All other systems reviewed and are negative.   Physical Exam Triage Vital Signs ED Triage Vitals  Enc Vitals Group     BP      Pulse      Resp      Temp      Temp src      SpO2      Weight      Height      Head Circumference      Peak Flow      Pain Score      Pain Loc      Pain Edu?      Excl. in GC?    No  data found.  Updated Vital Signs BP (!) 149/82   Pulse (!) 101   Temp 98.6 F (37 C)   Resp 18   SpO2 98%   Visual Acuity Right Eye Distance:   Left Eye Distance:   Bilateral Distance:    Right Eye Near:   Left Eye Near:    Bilateral Near:     Physical Exam Vitals and nursing note reviewed.  Constitutional:      General: She is not in acute distress.    Appearance: Normal appearance. She is well-developed. She is not ill-appearing.  HENT:     Mouth/Throat:     Mouth: Mucous membranes are moist.  Cardiovascular:     Rate and Rhythm: Normal rate and regular rhythm.     Heart sounds: Normal heart sounds.  Pulmonary:     Effort: Pulmonary effort is normal. No respiratory distress.     Breath sounds: Normal breath sounds.  Abdominal:     Palpations: Abdomen is soft.     Tenderness: There is no abdominal tenderness. There is no right CVA tenderness, left CVA tenderness, guarding or rebound.  Musculoskeletal:     Cervical back: Neck supple.  Skin:    General: Skin is warm and dry.  Neurological:     Mental Status: She is alert.  Psychiatric:        Mood and Affect: Mood normal.        Behavior: Behavior normal.     UC Treatments / Results  Labs (all labs ordered are listed, but only abnormal results are displayed) Labs Reviewed  POCT URINALYSIS DIP (MANUAL ENTRY) - Abnormal; Notable for the following components:      Result Value   Clarity, UA cloudy (*)    Blood, UA trace-intact (*)    Nitrite, UA Positive (*)    Leukocytes, UA Small (1+) (*)    All other components within normal limits  URINE CULTURE    EKG   Radiology No results found.  Procedures Procedures (including critical care time)  Medications Ordered in UC Medications - No data to display  Initial Impression / Assessment and Plan / UC Course  I have reviewed the triage vital signs and the nursing notes.  Pertinent labs & imaging results that were available during my care of the patient  were reviewed by me and considered in my medical decision making (see chart for details).  UTI with hematuria.  Treating with Keflex. Urine culture pending. Discussed with patient that we will call  her if the urine culture shows the need to change or discontinue the antibiotic. Instructed her to follow-up with her PCP if her symptoms are not improving. Patient agrees to plan of care.      Final Clinical Impressions(s) / UC Diagnoses   Final diagnoses:  Urinary tract infection with hematuria, site unspecified     Discharge Instructions      Take the antibiotic as directed.  The urine culture is pending.  We will call you if it shows the need to change or discontinue your antibiotic.    Follow up with your primary care provider if your symptoms are not improving.         ED Prescriptions     Medication Sig Dispense Auth. Provider   cephALEXin (KEFLEX) 500 MG capsule Take 1 capsule (500 mg total) by mouth 2 (two) times daily for 5 days. 10 capsule Sharion Balloon, NP      PDMP not reviewed this encounter.   Sharion Balloon, NP 05/21/21 1358

## 2021-05-21 NOTE — ED Triage Notes (Signed)
Patient c/o dysuria and urinary frequency x 3-4 days.   Patient denies ABD pain, pelvic pain, vaginal discharge, N/V, or fever.   Patient states " it burns so much with peeing".   Patient has taken AZO with some relief of symptoms.

## 2021-05-22 LAB — URINE CULTURE: Culture: <10000 — AB

## 2021-08-09 ENCOUNTER — Ambulatory Visit
Admission: RE | Admit: 2021-08-09 | Discharge: 2021-08-09 | Disposition: A | Discharge status: Home / Self Care | Payer: Medicare Other | Source: Ambulatory Visit | Attending: Family Medicine | Admitting: Family Medicine

## 2021-08-09 ENCOUNTER — Ambulatory Visit (INDEPENDENT_AMBULATORY_CARE_PROVIDER_SITE_OTHER): Payer: Medicare Other

## 2021-08-09 VITALS — BP 130/82 | HR 91 | Temp 97.7°F | Resp 18

## 2021-08-09 DIAGNOSIS — R058 Other specified cough: Secondary | ICD-10-CM | POA: Diagnosis not present

## 2021-08-09 MED ORDER — GUAIFENESIN ER 600 MG PO TB12: 1200.0000 mg | ORAL_TABLET | Freq: Two times a day (BID) | ORAL | 0 refills | Status: AC | PRN

## 2021-08-09 MED ORDER — CETIRIZINE HCL 10 MG PO TABS: 10.0000 mg | ORAL_TABLET | Freq: Every evening | ORAL | 0 refills | Status: AC | PRN

## 2021-08-09 NOTE — ED Provider Notes (Incomplete)
Tara Acosta    CSN: [id number] Arrival date & time: 08/09/21  1336      History   Chief Complaint Chief Complaint  Patient presents with   Cough    Entered by patient    HPI Tara Acosta is a 71 y.o. female.   HPI Patient presents for evaluation of non productive cough x 2 months. She reports symptoms occur mostly in the morning after awakening. She denies any known sick contacts. She had pneumonia back January and recovered from those symptoms. She has not taken any medication for cough. Denies fever, nasal or sinus associated symptoms. Past Medical History:  Diagnosis Date   Family history of anesthesia complication    family /w N&V   GERD (gastroesophageal reflux disease)    NEPHROLITHIASIS, HX OF 02/06/2007   cystoscopy- with anesth.    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   Osteoarthritis 02/06/2007   NEPHROLITHIASIS, HX OF 02/06/2007    Past Surgical History:  Procedure Laterality Date   COLONOSCOPY  2009   TOTAL HIP ARTHROPLASTY Right 06/26/2012   Procedure: RIGHT TOTAL HIP ARTHROPLASTY ANTERIOR APPROACH;  Surgeon: Kathryne Hitch, MD;  Location: [facility];  Service: Orthopedics;  Laterality: Right;   TOTAL HIP ARTHROPLASTY Left 08/04/2015   TOTAL HIP ARTHROPLASTY Left 08/04/2015   Procedure: LEFT TOTAL HIP ARTHROPLASTY ANTERIOR APPROACH;  Surgeon: Kathryne Hitch, MD;  Location: [facility];  Service: Orthopedics;  Laterality: Left;   TUBAL LIGATION      OB History   No obstetric history on file.      Home  Medications    Prior to Admission medications   Medication Sig Start Date End Date Taking? Authorizing Provider  cetirizine (ZYRTEC ALLERGY) 10 MG tablet Take 1 tablet (10 mg total) by mouth at bedtime as needed for allergies. 08/09/21  Yes Bing Neighbors, FNP  guaiFENesin (MUCINEX) 600 MG 12 hr tablet Take 2 tablets (1,200 mg total) by mouth 2 (two) times daily as needed. 08/09/21  Yes Bing Neighbors, FNP  atorvastatin (LIPITOR) 20 MG tablet Take 1 tablet (20 mg total) by mouth daily. 03/31/21   Philip Aspen, Limmie Patricia, MD  ibuprofen (ADVIL,MOTRIN) 200 MG tablet Take 200-400 mg by mouth every 6 (six) hours as needed for moderate pain.     [provider]  Multiple Vitamin (MULTIVITAMIN) tablet Take 1 tablet by mouth daily.    [provider]  triamcinolone cream (KENALOG) 0.1 % SMARTSIG:1 sparingly Topical Twice Daily PRN 02/19/21   [provider]  vitamin C (ASCORBIC ACID) 500 MG tablet Take 500 mg by mouth daily.    [provider]    Family History Family History  Problem Relation Age of Onset   Gout Father    Hypertension Father    Dementia Father    Hypertension Mother    Coronary artery disease Mother    Pancreatic cancer Unknown        Uncle    Social History Social History   Tobacco Use   Smoking status: Never   Smokeless  tobacco: Never  Substance Use Topics   Alcohol use: Yes    Comment: social   Drug use: No     Allergies   Oxycodone   Review of Systems Review of Systems Pertinent negatives listed in HPI   Physical Exam Triage Vital Signs ED Triage Vitals  Enc Vitals Group     BP 08/09/21 1340 (!) 151/77     Pulse Rate 08/09/21 1340 91     Resp 08/09/21 1340 18     Temp 08/09/21 1340 97.7 F (36.5 C)     Temp Source 08/09/21 1340 Temporal     SpO2 08/09/21 1340 97 %     Weight --      Height --      Head Circumference --      Peak Flow --      Pain Score 08/09/21 1344 0     Pain Loc --      Pain Edu?  --      Excl. in GC? --    No data found.  Updated Vital Signs BP 130/82   Pulse 91   Temp 97.7 F (36.5 C) (Temporal)   Resp 18   SpO2 97%   Visual Acuity Right Eye Distance:   Left Eye Distance:   Bilateral Distance:    Right Eye Near:   Left Eye Near:    Bilateral Near:     Physical Exam Vitals reviewed.  Constitutional:      General: She is not in acute distress.    Appearance: Normal appearance. She is not ill-appearing.  HENT:     Head: Normocephalic and atraumatic.     Nose: Congestion present. No rhinorrhea.  Eyes:     Extraocular Movements: Extraocular movements intact.     Pupils: Pupils are equal, round, and reactive to light.  Cardiovascular:     Rate and Rhythm: Normal rate and regular rhythm.  Pulmonary:     Effort: Pulmonary effort is normal.     Breath sounds: Normal breath sounds. No wheezing or rhonchi.  Musculoskeletal:        General: Normal range of motion.     Cervical back: Normal range of motion and neck supple.  Lymphadenopathy:     Cervical: No cervical adenopathy.  Skin:    General: Skin is warm and dry.     Capillary Refill: Capillary refill takes less than 2 seconds.  Neurological:     General: No focal deficit present.     Mental Status: She is alert and oriented to person, place, and time.  Psychiatric:        Mood and Affect: Mood normal.        Behavior: Behavior normal.        Thought Content: Thought content normal.        Judgment: Judgment normal.     UC Treatments / Results  Labs (all labs ordered are listed, but only abnormal results are displayed) Labs Reviewed - No data to display  EKG   Radiology DG Chest 2 View  Result Date: 08/09/2021 CLINICAL DATA:  Cough for greater than 8 weeks EXAM: CHEST - 2 VIEW COMPARISON:  January 18, 2021 FINDINGS: The heart size and mediastinal contours are within normal limits. No focal pulmonary opacity. Unchanged mild anterior wedging of multiple midthoracic vertebral bodies.  No acute osseous abnormality. The visualized upper abdomen is unremarkable. IMPRESSION: No active cardiopulmonary disease. Electronically Signed   By: Jacob Moores M.D.   On: 08/09/2021 14:20  Procedures Procedures (including critical care time)  Medications Ordered in UC Medications - No data to display  Initial Impression / Assessment and Plan / UC Course  I have reviewed the triage vital signs and the nursing notes.  Pertinent labs & imaging results that were available during my care of the patient were reviewed by me and considered in my medical decision making (see chart for details).    Chest x-ray unremarkable. Exam findings are unremarkable. Recommended OTC management of symptoms and follow-up with PCP. Final Clinical Impressions(s) / UC Diagnoses   Final diagnoses:  Cough present for greater than 3 weeks     Discharge Instructions      Cough is likely related to a combination of allergies and post nasal drainage.  Recommend Mucinex daily and Zyrtec. Your chest x-ray and lung exam is completely.  If your concerns continue follow-up with your primary care provider.     ED Prescriptions     Medication Sig Dispense Auth. Provider   guaiFENesin (MUCINEX) 600 MG 12 hr tablet Take 2 tablets (1,200 mg total) by mouth 2 (two) times daily as needed. 30 tablet Bing Neighbors, FNP   cetirizine (ZYRTEC ALLERGY) 10 MG tablet Take 1 tablet (10 mg total) by mouth at bedtime as needed for allergies. 30 tablet Bing Neighbors, FNP      PDMP not reviewed this encounter.   Bing Neighbors, FNP 08/10/21 1530    Bing Neighbors, FNP 08/10/21 1550

## 2021-08-09 NOTE — Discharge Instructions (Addendum)
Cough is likely related to a combination of allergies and post nasal drainage.  Recommend Mucinex daily and Zyrtec. Your chest x-ray and lung exam is completely.  If your concerns continue follow-up with your primary care provider.

## 2021-08-09 NOTE — ED Triage Notes (Signed)
Patient presents to Urgent Care with complaints of cough for over 2 months.  Treating with mucinex. Hx of bronchitis and pneumonia.  Denies fever or SOB.

## 2021-08-31 ENCOUNTER — Ambulatory Visit (INDEPENDENT_AMBULATORY_CARE_PROVIDER_SITE_OTHER): Payer: Medicare Other

## 2021-08-31 ENCOUNTER — Ambulatory Visit (INDEPENDENT_AMBULATORY_CARE_PROVIDER_SITE_OTHER): Payer: Medicare Other | Admitting: Family

## 2021-08-31 ENCOUNTER — Encounter: Payer: Self-pay | Admitting: Family

## 2021-08-31 DIAGNOSIS — M23301 Other meniscus derangements, unspecified lateral meniscus, left knee: Secondary | ICD-10-CM | POA: Diagnosis not present

## 2021-08-31 DIAGNOSIS — M25561 Pain in right knee: Secondary | ICD-10-CM

## 2021-08-31 MED ORDER — METHYLPREDNISOLONE ACETATE 40 MG/ML IJ SUSP
40.0000 mg | INTRAMUSCULAR | Status: AC | PRN
  Administered 2021-08-31: 40 mg via INTRA_ARTICULAR

## 2021-08-31 MED ORDER — LIDOCAINE HCL 1 % IJ SOLN
5.0000 mL | INTRAMUSCULAR | Status: AC | PRN
  Administered 2021-08-31: 5 mL

## 2021-08-31 NOTE — Progress Notes (Signed)
Office Visit Note   Patient: Tara Acosta           Date of Birth: 1950/12/16           MRN: [id number] Visit Date: 08/31/2021              Requested by: Philip Aspen, Limmie Patricia, MD [address] [place],  [place] [address] PCP: Philip Aspen, Limmie Patricia, MD  No chief complaint on file.     HPI: The patient is a 71 year old woman who presents today for initial evaluation of right knee pain this has been ongoing for about 2 months she has used Tylenol and Advil with minimal relief of her pain she states she has had this back on the exercises of the lidocaine no longer do squats.  Intermittent swelling pain with full extension pain in bed at night especially with extension of her knee  Denies mechanical symptoms  Assessment & Plan: Visit Diagnoses:  1. Acute pain of right knee     Plan: Lateral meniscus injury right: :we will Depo-Medrol injection of the right knee.  Patient tolerated well we will follow-up with her in about 4 weeks if we cannot get relief with cortisone we will try supplemental injection next.  Follow-Up Instructions: No follow-ups on file.   Right Knee Exam   Tenderness  The patient is experiencing tenderness in the lateral joint line and medial joint line.  Range of Motion  The patient has normal right knee ROM.  Tests  Varus: negative Valgus: negative  Other  Erythema: absent Swelling: none Effusion: no effusion present      Patient is alert, oriented, no adenopathy, well-dressed, normal affect, normal respiratory effort.   Imaging: No results found. No images are attached to the encounter.  Labs: Lab Results  Component Value Date   HGBA1C 5.5 01/14/2020   REPTSTATUS 05/22/2021 FINAL 05/21/2021   CULT (A) 05/21/2021    <10,000 COLONIES/mL INSIGNIFICANT GROWTH Performed at [facility], [address], [place], [place] [address]      Lab Results  Component Value Date   ALBUMIN 4.4 03/31/2021   ALBUMIN 4.6  01/14/2020   ALBUMIN 4.4 06/28/2017    No results found for: "MG" Lab Results  Component Value Date   VD25OH 31.55 03/31/2021   VD25OH 32.62 01/14/2020    No results found for: "PREALBUMIN"    Latest Ref Rng & Units 03/31/2021   10:14 AM 01/14/2020   10:10 AM 06/28/2017   10:01 AM  CBC EXTENDED  WBC 4.0 - 10.5 K/uL 4.4  4.0  3.9   RBC 3.87 - 5.11 Mil/uL 4.48  4.42  4.17   Hemoglobin 12.0 - 15.0 g/dL 68.1  27.5  17.0   HCT 36.0 - 46.0 % 41.5  40.5  39.0   Platelets 150.0 - 400.0 K/uL 211.0  217.0  255.0   NEUT# 1.4 - 7.7 K/uL 2.2  2.1  2.0   Lymph# 0.7 - 4.0 K/uL 1.6  1.4  1.4      There is no height or weight on file to calculate BMI.  Orders:  Orders Placed This Encounter  Procedures   XR Knee 1-2 Views Right   No orders of the defined types were placed in this encounter.    Procedures: Large Joint Inj: R knee on 08/31/2021 10:25 AM Indications: pain Details: 18 G 1.5 in needle, anteromedial approach Medications: 5 mL lidocaine 1 %; 40 mg methylPREDNISolone acetate 40 MG/ML Consent was given  by the patient.      Clinical Data: No additional findings.  ROS:  All other systems negative, except as noted in the HPI. Review of Systems  Constitutional: Negative.   Musculoskeletal:  Positive for arthralgias. Negative for gait problem.    Objective: Vital Signs: There were no vitals taken for this visit.  Specialty Comments:  No specialty comments available.  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    Family History  Problem Relation Age of Onset   Gout Father    Hypertension Father    Dementia Father    Hypertension Mother    Coronary artery disease Mother    Pancreatic cancer Unknown        Uncle    Past Surgical History:  Procedure Laterality Date   COLONOSCOPY  2009   TOTAL HIP ARTHROPLASTY Right 06/26/2012   Procedure: RIGHT TOTAL HIP ARTHROPLASTY ANTERIOR APPROACH;  Surgeon: Kathryne Hitch, MD;  Location: [facility];  Service: Orthopedics;  Laterality: Right;   TOTAL HIP ARTHROPLASTY Left 08/04/2015   TOTAL HIP ARTHROPLASTY Left 08/04/2015   Procedure: LEFT TOTAL HIP ARTHROPLASTY ANTERIOR APPROACH;  Surgeon: Kathryne Hitch, MD;  Location: [facility];  Service: Orthopedics;  Laterality: Left;   TUBAL LIGATION     Social History   Occupational History   Occupation: retired    Associate Professor: [employer]  Tobacco Use   Smoking status: Never   Smokeless tobacco: Never  Substance and Sexual Activity   Alcohol use: Yes    Comment: social   Drug use: No   Sexual activity: Not on file

## 2021-09-03 ENCOUNTER — Other Ambulatory Visit: Payer: Self-pay | Admitting: Family Medicine

## 2022-03-08 ENCOUNTER — Telehealth: Payer: Self-pay | Admitting: Orthopaedic Surgery

## 2022-03-08 NOTE — Telephone Encounter (Signed)
Note entered and faxed.

## 2022-03-08 NOTE — Telephone Encounter (Signed)
Patient is at the dentist office and patient states she doesn't need a pre med, however the office needs that in writing.please sent to  [facility]. Fax [phone]
